# Patient Record
Sex: Male | Born: 1945 | Race: White | Hispanic: No | Marital: Single | Smoking: Former smoker
Health system: Southern US, Community
[De-identification: ages and names within clinical notes are randomized; demographics above are authoritative.]

---

## 2021-07-03 ENCOUNTER — Other Ambulatory Visit: Payer: Self-pay

## 2021-07-03 ENCOUNTER — Emergency Department (HOSPITAL_COMMUNITY): Payer: Medicare Other

## 2021-07-03 ENCOUNTER — Inpatient Hospital Stay (HOSPITAL_COMMUNITY)
Admission: EM | Admit: 2021-07-03 | Discharge: 2021-07-06 | DRG: 683 | Disposition: A | Discharge status: Skilled Nursing Facility | Payer: Medicare Other | Attending: Internal Medicine | Admitting: Internal Medicine

## 2021-07-03 ENCOUNTER — Encounter (HOSPITAL_COMMUNITY): Payer: Self-pay | Admitting: Emergency Medicine

## 2021-07-03 DIAGNOSIS — N139 Obstructive and reflux uropathy, unspecified: Secondary | ICD-10-CM | POA: Diagnosis present

## 2021-07-03 DIAGNOSIS — N2889 Other specified disorders of kidney and ureter: Secondary | ICD-10-CM | POA: Diagnosis present

## 2021-07-03 DIAGNOSIS — R338 Other retention of urine: Secondary | ICD-10-CM | POA: Diagnosis present

## 2021-07-03 DIAGNOSIS — I160 Hypertensive urgency: Secondary | ICD-10-CM | POA: Diagnosis present

## 2021-07-03 DIAGNOSIS — N179 Acute kidney failure, unspecified: Principal | ICD-10-CM | POA: Diagnosis present

## 2021-07-03 DIAGNOSIS — R339 Retention of urine, unspecified: Secondary | ICD-10-CM

## 2021-07-03 DIAGNOSIS — E860 Dehydration: Secondary | ICD-10-CM | POA: Diagnosis present

## 2021-07-03 DIAGNOSIS — N390 Urinary tract infection, site not specified: Secondary | ICD-10-CM | POA: Diagnosis present

## 2021-07-03 DIAGNOSIS — K5900 Constipation, unspecified: Secondary | ICD-10-CM | POA: Diagnosis present

## 2021-07-03 DIAGNOSIS — I10 Essential (primary) hypertension: Secondary | ICD-10-CM | POA: Diagnosis present

## 2021-07-03 DIAGNOSIS — N1330 Unspecified hydronephrosis: Secondary | ICD-10-CM | POA: Diagnosis present

## 2021-07-03 DIAGNOSIS — E872 Acidosis, unspecified: Secondary | ICD-10-CM | POA: Diagnosis present

## 2021-07-03 DIAGNOSIS — N401 Enlarged prostate with lower urinary tract symptoms: Secondary | ICD-10-CM | POA: Diagnosis present

## 2021-07-03 DIAGNOSIS — N136 Pyonephrosis: Secondary | ICD-10-CM | POA: Diagnosis present

## 2021-07-03 DIAGNOSIS — Z20822 Contact with and (suspected) exposure to covid-19: Secondary | ICD-10-CM | POA: Diagnosis present

## 2021-07-03 DIAGNOSIS — E876 Hypokalemia: Secondary | ICD-10-CM | POA: Diagnosis present

## 2021-07-03 DIAGNOSIS — N400 Enlarged prostate without lower urinary tract symptoms: Secondary | ICD-10-CM | POA: Diagnosis present

## 2021-07-03 DIAGNOSIS — R319 Hematuria, unspecified: Secondary | ICD-10-CM | POA: Diagnosis present

## 2021-07-03 DIAGNOSIS — N19 Unspecified kidney failure: Secondary | ICD-10-CM | POA: Diagnosis present

## 2021-07-03 DIAGNOSIS — W19XXXA Unspecified fall, initial encounter: Secondary | ICD-10-CM | POA: Diagnosis present

## 2021-07-03 DIAGNOSIS — Z87891 Personal history of nicotine dependence: Secondary | ICD-10-CM

## 2021-07-03 DIAGNOSIS — R918 Other nonspecific abnormal finding of lung field: Secondary | ICD-10-CM | POA: Diagnosis present

## 2021-07-03 DIAGNOSIS — N3001 Acute cystitis with hematuria: Secondary | ICD-10-CM

## 2021-07-03 LAB — BASIC METABOLIC PANEL
Anion gap: 17 — ABNORMAL HIGH (ref 5–15)
BUN: 87 mg/dL — ABNORMAL HIGH (ref 8–23)
CO2: 19 mmol/L — ABNORMAL LOW (ref 22–32)
Calcium: 8.2 mg/dL — ABNORMAL LOW (ref 8.9–10.3)
Chloride: 101 mmol/L (ref 98–111)
Creatinine, Ser: 6.55 mg/dL — ABNORMAL HIGH (ref 0.61–1.24)
GFR, Estimated: 8 mL/min — ABNORMAL LOW (ref >60)
Glucose, Bld: 173 mg/dL — ABNORMAL HIGH (ref 70–99)
Potassium: 4 mmol/L (ref 3.5–5.1)
Sodium: 137 mmol/L (ref 135–145)

## 2021-07-03 LAB — URINALYSIS, ROUTINE W REFLEX MICROSCOPIC

## 2021-07-03 LAB — URINALYSIS, MICROSCOPIC (REFLEX)
Bacteria, UA: NONE SEEN
RBC / HPF: >50 RBC/hpf (ref 0–5)
Squamous Epithelial / HPF: NONE SEEN (ref 0–5)
WBC, UA: >50 WBC/hpf (ref 0–5)

## 2021-07-03 LAB — CBC
HCT: 40.3 % (ref 39.0–52.0)
Hemoglobin: 14.2 g/dL (ref 13.0–17.0)
MCH: 32.1 pg (ref 26.0–34.0)
MCHC: 35.2 g/dL (ref 30.0–36.0)
MCV: 91 fL (ref 80.0–100.0)
Platelets: 239 10*3/uL (ref 150–400)
RBC: 4.43 MIL/uL (ref 4.22–5.81)
RDW: 12.6 % (ref 11.5–15.5)
WBC: 9.6 10*3/uL (ref 4.0–10.5)
nRBC: 0 % (ref 0.0–0.2)

## 2021-07-03 LAB — CK: Total CK: 180 U/L (ref 49–397)

## 2021-07-03 LAB — LACTIC ACID, PLASMA
Lactic Acid, Venous: 2.3 mmol/L (ref 0.5–1.9)
Lactic Acid, Venous: 1.4 mmol/L (ref 0.5–1.9)

## 2021-07-03 MED ORDER — CEFTRIAXONE SODIUM 2 G IJ SOLR
2.0000 g | INTRAMUSCULAR | Status: DC
  Administered 2021-07-04 – 2021-07-06 (×3): 2 g via INTRAVENOUS
  Filled 2021-07-03 (×3): qty 20

## 2021-07-03 MED ORDER — ACETAMINOPHEN 325 MG PO TABS
650.0000 mg | ORAL_TABLET | Freq: Four times a day (QID) | ORAL | Status: DC | PRN
  Administered 2021-07-05 (×3): 650 mg via ORAL
  Filled 2021-07-03 (×3): qty 2

## 2021-07-03 MED ORDER — POLYETHYLENE GLYCOL 3350 17 G PO PACK: 17.0000 g | PACK | Freq: Every day | ORAL | Status: DC | PRN

## 2021-07-03 MED ORDER — SODIUM CHLORIDE 0.9 % IV BOLUS
2000.0000 mL | Freq: Once | INTRAVENOUS | Status: AC
  Administered 2021-07-03: 2000 mL via INTRAVENOUS

## 2021-07-03 MED ORDER — ACETAMINOPHEN 650 MG RE SUPP: 650.0000 mg | Freq: Four times a day (QID) | RECTAL | Status: DC | PRN

## 2021-07-03 MED ORDER — SODIUM CHLORIDE 0.9 % IV SOLN: INTRAVENOUS | Status: DC

## 2021-07-03 MED ORDER — SODIUM CHLORIDE 0.9% FLUSH
3.0000 mL | Freq: Two times a day (BID) | INTRAVENOUS | Status: DC
  Administered 2021-07-04: 3 mL via INTRAVENOUS

## 2021-07-03 MED ORDER — SODIUM CHLORIDE 0.9 % IR SOLN
3000.0000 mL | Status: DC
  Administered 2021-07-03: 3000 mL

## 2021-07-03 MED ORDER — SODIUM CHLORIDE 0.9 % IV SOLN
2.0000 g | Freq: Once | INTRAVENOUS | Status: AC
  Administered 2021-07-03: 2 g via INTRAVENOUS
  Filled 2021-07-03: qty 20

## 2021-07-03 NOTE — ED Notes (Signed)
Pt c/o increasing abd pain. Noted pt has had 1700 in and 1700 out of irrigation. Irrigation paused. Abd exam reveals pt with rigid abd. EDP notified.

## 2021-07-03 NOTE — ED Triage Notes (Signed)
Neighbor/pt stated, He has been sick for over a week with peeing straight blood in urine. He fell at his house over a week ago and hurt his back. He has not been eating for over a week Pt stated, Ive not had a bowel movement in a week. I pee a little all the time but its all bloody.

## 2021-07-03 NOTE — ED Notes (Addendum)
Bladder scanner completed , bloody discharge leaking from urethra.

## 2021-07-03 NOTE — ED Notes (Signed)
Awaiting irrigation cath from central supply. Placed dual lumen for emergent bladder decompression until irrigation cath is available.

## 2021-07-03 NOTE — ED Provider Notes (Signed)
Patient is a 75 year old male whose care was transferred to me at shift change from [facility].  His HPI is below:  75 year old male presents today for evaluation of persistent weakness, and fall that occurred 1 week ago.  Patient reports his weakness has been ongoing for about 1.5 weeks.  Reports he had not had a bowel movement for 1 week until this morning.  States since the fall that occurred 1 week ago where secondary to weakness patient stood up from his couch and fell backwards between his couch and his coffee table.  He denies loss of consciousness during this fall.  Reports since the fall he has had back pain and gross hematuria.  He denies fever, chills, shortness of breath, palpitations, chest pain, or headache.   Hematuria Associated symptoms include abdominal pain. Pertinent negatives include no chest pain and no shortness of breath.  Back Pain Associated symptoms: abdominal pain and weakness   Associated symptoms: no chest pain and no fever   Fall Associated symptoms include abdominal pain. Pertinent negatives include no chest pain and no shortness of breath.  Abdominal Pain Associated symptoms: hematuria   Associated symptoms: no chest pain, no chills, no fever, no nausea, no shortness of breath and no vomiting    Physical Exam  BP (!) 151/59    Pulse 90    Temp 98.3 F (36.8 C) (Oral)    Resp 14    SpO2 100%   Physical Exam Vitals and nursing note reviewed.  Constitutional:      General: He is not in acute distress.    Appearance: He is well-developed.  HENT:     Head: Normocephalic and atraumatic.     Right Ear: External ear normal.     Left Ear: External ear normal.  Eyes:     General: No scleral icterus.       Right eye: No discharge.        Left eye: No discharge.     Conjunctiva/sclera: Conjunctivae normal.  Neck:     Trachea: No tracheal deviation.  Cardiovascular:     Rate and Rhythm: Normal rate.  Pulmonary:     Effort: Pulmonary effort is normal. No  respiratory distress.     Breath sounds: No stridor.  Abdominal:     General: There is no distension.  Musculoskeletal:        General: No swelling or deformity.     Cervical back: Neck supple.  Skin:    General: Skin is warm and dry.     Findings: No rash.  Neurological:     Mental Status: He is alert.     Cranial Nerves: Cranial nerve deficit: no gross deficits.   ED Course/Procedures   Clinical Course as of 07/03/21 1843  Sat Jul 03, 2021  1701 Patient has produced about 2000 cc of hematuria. [LJ]  [id number] Patient discussed with Dr. Jonnie Finner with nephrology.  Recommends we obtain a renal ultrasound as well. [LJ]  [id number] Patient discussed with Dr. Diona Fanti with urology.  Request that nurses provide the urology cart at bedside.  He is going to evaluate the patient in the ED. [LJ]    Clinical Course User Index [LJ] Rayna Sexton, PA-C    .Critical Care Performed by: Rayna Sexton, PA-C Authorized by: Rayna Sexton, PA-C   Critical care provider statement:    Critical care time (minutes):  45   Critical care was necessary to treat or prevent imminent or life-threatening deterioration of the following conditions:  Renal  failure   Critical care was time spent personally by me on the following activities:  Development of treatment plan with patient or surrogate, discussions with consultants, evaluation of patient's response to treatment, examination of patient, ordering and review of laboratory studies, ordering and review of radiographic studies, ordering and performing treatments and interventions, pulse oximetry, re-evaluation of patient's condition and review of old charts  MDM  Patient is a 75 year old male with no known medical history as he has not seen a medical provider in about 25 years.  He states that about 2 weeks ago he began experiencing dysuria.  This morning he then began noticing hematuria.  Patient was found to be retaining urine and a Foley catheter was placed.   Since, patient has produced about 2000 cc of hematuria.  Lab work is since resulted.  CBC without abnormalities.  BMP with a CO2 of 19, glucose of 173, BUN of 87, creatinine 6.55, calcium of 8.2, GFR of 8, anion gap of 17.  Unsure of patient's baseline creatinine and GFR.  Possible acute renal failure.  UA mostly not readable due to frank hematuria but does show greater than 50 white blood cells.  Given patient's symptoms as well as this finding we will treat for acute cystitis with IV Rocephin.  Initial lactic acid 2.3 with a repeat of 1.4.  CK1 80.  Blood cultures obtained.  CT scan obtained of the abdomen/pelvis without contrast with findings as noted below:  IMPRESSION: 1. Numerous bladder calculi are present.   2. Heterogeneous hyperdensity in the bladder suspicious for hemorrhage measuring 7.0 x 5.6 x 8.1 cm. Bladder neoplasm can not be excluded.   3. Bladder is distended despite Foley catheter placement. There is mild bilateral hydroureteronephrosis.   4. Marked prostatomegaly.   5. There are pulmonary nodules in the left lower lobe measuring up to 5 mm. Consider follow-up dedicated chest CT for further evaluation. No follow-up needed if patient is low-risk (and has no known or suspected primary neoplasm). Non-contrast chest CT can be considered in 12 months if patient is high-risk. This recommendation follows the consensus statement: Guidelines for Management of Incidental Pulmonary Nodules Detected on CT Images: From the Fleischner Society 2017; Radiology 2017; 284:228-243.  Patient discussed with Dr. Arta Silence with nephrology.  Recommends we obtain a renal ultrasound as well.  Nephrology will follow.  Patient also discussed with Dr. Retta Diones with urology.  Requested the urology cart and he is evaluating the patient at bedside.  Patient will need to be admitted to the medicine team for treatment of his acute cystitis as well as further management of his acute renal failure.  This  has been discussed with the patient and he is amenable.  We will discuss with the medicine team at this time.        Placido Sou, PA-C 07/03/21 1843    Pricilla Loveless, MD 07/04/21 1517

## 2021-07-03 NOTE — Consult Note (Signed)
Renal Service Consult Note [facility]  Blake Medina 07/03/2021 Maree Krabbe, MD Requesting Physician: Dr. Criss Alvine  Reason for Consult: Renal failure HPI: The patient is a 75 y.o. year-old w/ no PMH presented to ED today for hematuria and dysuria for about 1 week.  Also fatigue and no appetite for about 2 wks. Hasn't seen a doctor in 25 yrs, on no medications. In ED BP's wnl, HR, RR stable. Labs show Hb 14, creat 6.5.  CT abd shows very large prostate, distended bladder despite foley in place, bladder hyperdensity c/w hemorrhage but can't rule out a mass, multiple bladder stones and bilat mild hydroureteronephrosis. In ED pt was found to be retaining urine and a Foley catheter was placed. Since then has produces about 2000 cc of hematuria. Asked to see for renal failure.   Pt seen in room. Pt lives alone in [place]. His kids all live in [place] he is not in touch with them. Quit smoking 4 yrs ago, no etoh. Main issues have been pain and blood w/ urination for 1-2 weeks. Also severe fatigue for a couple of weeks and poor appetite.    ROS - denies CP, no joint pain, no HA, no blurry vision, no rash, no diarrhea, no nausea/ vomiting   Past Medical History History reviewed. No pertinent past medical history. Past Surgical History History reviewed. No pertinent surgical history. Family History History reviewed. No pertinent family history. Social History  reports that he has quit smoking. His smoking use included cigarettes. He has never used smokeless tobacco. He reports that he does not currently use alcohol. He reports that he does not use drugs. Allergies Not on File Home medications Prior to Admission medications   Not on File     Vitals:   07/03/21 1400 07/03/21 1600 07/03/21 1700 07/03/21 1730  BP: (!) 155/63 126/64 (!) 158/68 (!) 151/59  Pulse: 86 83 83 90  Resp: 16 16 16 14   Temp:      TempSrc:      SpO2: 100% 100% 100% 100%   Exam Gen alert, no distress,  pleasant elderly man in no distress No rash, cyanosis or gangrene Sclera anicteric, throat is moderately dry  No jvd or bruits, flat neck veins Chest clear bilat to bases, no rales/ wheezing RRR no MRG Abd soft ntnd no mass or ascites +bs GU foley cath in place MS no joint effusions or deformity Ext no LE or UE edema, no wounds or ulcers Neuro is alert, Ox 3 , nf, no asterixis      Home meds include - none      UA turbid, red color, > 50 rbc/ wbc per hpf, other tests not valid due to severe hematuria     CT abd w/o contrast 12/31 - IMPRESSION: 1. Numerous bladder calculi are present.  2. Heterogeneous hyperdensity in the bladder suspicious for hemorrhage measuring 7.0 x 5.6 x 8.1 cm. Bladder neoplasm can not be excluded. 3. Bladder is distended despite Foley catheter placement. There is mild bilateral hydroureteronephrosis. 4. Marked prostatomegaly 5. There are pulmonary nodules in the left lower lobe measuring up to 5 mm.    Na 137  K 4.0  CUN 87  Creat 6.55  Ca 8.2  WBC 9K Hb 14    Assessment/ Plan: Renal failure - presumably this is acute renal failure, no old labs, no medical care for 25 yrs. Pt here w/ creat 6.5 and CT showing bilat hydronephrosis in setting of recent onset difficulty voiding,  dysuria and hematuria.  After foley placed in ED about 2000 cc of hematuria has been produced. Probably has some early uremia w/ fatigue and loss of appetite. Would not recommend HD however as UOP after foley placement is very good and renal function should improve. Also appears dry on exam, will give IVF's. Urology is consulting. Will follow.  Hematuria/ voiding difficulty - per urology Volume depletion - not eating well last couple of weeks. Will bolus 2 L and run NS 0.9% at 100 cc/hr.        Kelly Splinter  MD 07/03/2021, 6:44 PM  Recent Labs  Lab 07/03/21 0747  WBC 9.6  HGB 14.2   Recent Labs  Lab 07/03/21 0747  K 4.0  BUN 87*  CREATININE 6.55*  CALCIUM 8.2*

## 2021-07-03 NOTE — ED Provider Notes (Signed)
I provided a substantive portion of the care of this patient.  I personally performed the entirety of the exam for this encounter.      Patient has generalized weakness.  Traces this back to a fall that occurred a week ago.  Since then he reports pain in his back and blood in the urine.  Patient is alert with clear mental status.  No respiratory distress.  Lungs grossly clear.  Heart regular.  Lower abdomen is firm.  He is denying significant tenderness at this point time.  He is already had a Foley catheter placed.  Bloody urine in the catheter bag  Patient presents as outlined.  Labs reveal acute renal failure.  At this point likely obstructive.  Agree with proceeding with CT scan.  Patient will require admission.  I agree with plan of management.     Arby Barrette, MD 07/07/21 [phone]

## 2021-07-03 NOTE — ED Notes (Signed)
Patient transported to Ultrasound 

## 2021-07-03 NOTE — ED Notes (Signed)
Patient transported to CT 

## 2021-07-03 NOTE — ED Notes (Signed)
Urologist at bedside.

## 2021-07-03 NOTE — Consult Note (Signed)
Urology Consult  Consulting MD: Beola Cord, MD  CC: Blood in urine, bladder stones  HPI: This is a 75year old male presenting to the emergency room at [facility] today with history of dysuria, gross hematuria, weakness and a recent fall.  He has no prior urologic history.  He apparently has not seen a physician in over 20 years.  He states that for the past few weeks he has had dysuria and worsening lower urinary tract symptoms.  Within the past 24 hours he has started to have gross hematuria.  Combined with weakness and a recent fall, he called a family member and was brought to the emergency room.  In the emergency room, with his history of hematuria, CT scan of the abdomen and pelvis was performed without contrast (patient had acute renal failure).  This revealed mild hydronephrosis, distended bladder, multiple large bladder calculi, large prostate and probable clot in the bladder.  The CT scan was taken after a catheter was placed.  Because of the above-mentioned urologic issues, urologic consultation was requested.  PMH: History reviewed. No pertinent past medical history.  PSH: History reviewed. No pertinent surgical history.  Allergies: Not on File  Medications: (Not in a hospital admission)    Social History: Social History   Socioeconomic History   Marital status: Single    Spouse name: Not on file   Number of children: Not on file   Years of education: Not on file   Highest education level: Not on file  Occupational History   Not on file  Tobacco Use   Smoking status: Former    Types: Cigarettes   Smokeless tobacco: Never  Substance and Sexual Activity   Alcohol use: Not Currently   Drug use: Never   Sexual activity: Not on file  Other Topics Concern   Not on file  Social History Narrative   Not on file   Social Determinants of Health   Financial Resource Strain: Not on file  Food Insecurity: Not on file  Transportation Needs: Not on  file  Physical Activity: Not on file  Stress: Not on file  Social Connections: Not on file  Intimate Partner Violence: Not on file    Family History: History reviewed. No pertinent family history.  Review of Systems: Positive: Gross hematuria, dysuria, slow stream, frequency, urgency, feeling of incomplete emptying Negative: No fever or chills.  A further 10 point review of systems was negative except what is listed in the HPI.  Physical Exam: @VITALS2 @ General: No acute distress.  Awake. Head:  Normocephalic.  Atraumatic. ENT:  EOMI.  Mucous membranes moist Neck:  Supple.  No lymphadenopathy. CV:  Regular rate. Pulmonary: Equal effort bilaterally.   Abdomen: Soft.  None tender to palpation.  No masses present. Skin:  Normal turgor.  No visible rash. Extremity: No gross deformity of extremities.  Neurologic: Alert. Appropriate mood. Penis:  Incircumcised.  No lesions. Urethra: Orthotopic meatus.  18 French three-way Foley catheter was present Scrotum: No lesions.  No ecchymosis.  No erythema. Testicles: Descended bilaterally.  No masses bilaterally. Epididymis: Palpable bilaterally.  Non Tender to palpation.  Studies:  Recent Labs    07/03/21 0747  HGB 14.2  WBC 9.6  PLT 239    Recent Labs    07/03/21 0747  NA 137  K 4.0  CL 101  CO2 19*  BUN 87*  CREATININE 6.55*  CALCIUM 8.2*  GFRNONAA 8*     No results for input(s): INR, APTT in the  last 72 hours.  Invalid input(s): PT   Invalid input(s): ABG  I reviewed the above-mentioned laboratories as well as personally reviewed CT images.  When I came in to see the patient, he was in the ultrasound department.  Even after CT scan was performed, this was requested.  Procedure note: The 58 French Foley catheter was removed.  After sterile prep and drape and using 30 cc of 2% viscous lidocaine in the urethra, a 24 [place] three-way Foley/hematuria catheter was placed.  I irrigated with 1 L of saline.  Multiple clots  were liberated.  Following this, irrigant/effluent was grapefruit colored.  Assessment: 1.  Gross hematuria, possibly due to infection/stones.  Less likely neoplasm.  He does have a large prostate.  Clot was evacuated  2.  Urinary retention with associated hydronephrosis and acute renal failure.  Treated emergently with Foley catheter  3.  Multiple large bladder calculi    Plan: 1.  Catheter will be left in place.  I do not think he needs to be on CBI at present.  I will order routine catheter irrigation by nursing staff  2.  Appropriate urine studies/urine culture.  Currently I would recommend covering with antibiotics  3.  Hopefully, acute renal failure will resolve with catheter placement.  He will need to have this catheter for quite some time.  Unfortunately, he does not have good support at home, so until further management of his prostate/bladder stones is performed, I would leave the catheter in  4.  Eventually, I would suggest TURP or other procedure for outlet obstruction i.e. HoLEP or simple prostatectomy.  At the same time, cystolitholapaxy can be done  5.  We will follow during hospitalization    Pager:[phone]

## 2021-07-03 NOTE — ED Provider Notes (Signed)
[facility] EMERGENCY DEPARTMENT Provider Note   CSN: [id number] Arrival date & time: 07/03/21  [id number]     History Chief Complaint  Patient presents with   Hematuria   Back Pain   Fall   Abdominal Pain    Blake Medina is a 75 y.o. male.  75 year old male presents today for evaluation of persistent weakness, and fall that occurred 1 week ago.  Patient reports his weakness has been ongoing for about 1.5 weeks.  Reports he had not had a bowel movement for 1 week until this morning.  States since the fall that occurred 1 week ago where secondary to weakness patient stood up from his couch and fell backwards between his couch and his coffee table.  He denies loss of consciousness during this fall.  Reports since the fall he has had back pain and gross hematuria.  He denies fever, chills, shortness of breath, palpitations, chest pain, or headache.   Hematuria Associated symptoms include abdominal pain. Pertinent negatives include no chest pain and no shortness of breath.  Back Pain Associated symptoms: abdominal pain and weakness   Associated symptoms: no chest pain and no fever   Fall Associated symptoms include abdominal pain. Pertinent negatives include no chest pain and no shortness of breath.  Abdominal Pain Associated symptoms: hematuria   Associated symptoms: no chest pain, no chills, no fever, no nausea, no shortness of breath and no vomiting       No past medical history on file.  There are no problems to display for this patient.   History reviewed. No pertinent surgical history.     No family history on file.  Social History   Tobacco Use   Smoking status: Former    Types: Cigarettes   Smokeless tobacco: Never  Substance Use Topics   Alcohol use: Not Currently   Drug use: Never    Home Medications Prior to Admission medications   Not on File    Allergies    Patient has no allergy information on record.  Review of Systems    Review of Systems  Constitutional:  Negative for activity change, chills and fever.  Respiratory:  Negative for shortness of breath.   Cardiovascular:  Negative for chest pain and palpitations.  Gastrointestinal:  Positive for abdominal pain. Negative for nausea and vomiting.  Genitourinary:  Positive for hematuria.  Musculoskeletal:  Positive for back pain.  Neurological:  Positive for weakness. Negative for syncope and light-headedness.  All other systems reviewed and are negative.  Physical Exam Updated Vital Signs BP (!) 129/59    Pulse 91    Temp 98.3 F (36.8 C) (Oral)    Resp 16    SpO2 100%   Physical Exam Vitals and nursing note reviewed.  Constitutional:      General: He is not in acute distress.    Appearance: Normal appearance. He is not ill-appearing.  HENT:     Head: Normocephalic and atraumatic.     Nose: Nose normal.  Eyes:     General: No scleral icterus.    Extraocular Movements: Extraocular movements intact.     Conjunctiva/sclera: Conjunctivae normal.  Cardiovascular:     Rate and Rhythm: Normal rate and regular rhythm.     Pulses: Normal pulses.     Heart sounds: Normal heart sounds.  Pulmonary:     Effort: Pulmonary effort is normal. No respiratory distress.     Breath sounds: Normal breath sounds. No wheezing or rales.  Abdominal:  General: There is no distension.     Tenderness: There is no abdominal tenderness.  Musculoskeletal:        General: Normal range of motion.     Cervical back: Normal range of motion.  Skin:    General: Skin is warm and dry.  Neurological:     General: No focal deficit present.     Mental Status: He is alert. Mental status is at baseline.    ED Results / Procedures / Treatments   Labs (all labs ordered are listed, but only abnormal results are displayed) Labs Reviewed  URINALYSIS, ROUTINE W REFLEX MICROSCOPIC - Abnormal; Notable for the following components:      Result Value   Color, Urine RED (*)     APPearance TURBID (*)    Glucose, UA   (*)    Value: TEST NOT REPORTED DUE TO COLOR INTERFERENCE OF URINE PIGMENT   Hgb urine dipstick   (*)    Value: TEST NOT REPORTED DUE TO COLOR INTERFERENCE OF URINE PIGMENT   Bilirubin Urine   (*)    Value: TEST NOT REPORTED DUE TO COLOR INTERFERENCE OF URINE PIGMENT   Ketones, ur   (*)    Value: TEST NOT REPORTED DUE TO COLOR INTERFERENCE OF URINE PIGMENT   Protein, ur   (*)    Value: TEST NOT REPORTED DUE TO COLOR INTERFERENCE OF URINE PIGMENT   Nitrite   (*)    Value: TEST NOT REPORTED DUE TO COLOR INTERFERENCE OF URINE PIGMENT   Leukocytes,Ua   (*)    Value: TEST NOT REPORTED DUE TO COLOR INTERFERENCE OF URINE PIGMENT   All other components within normal limits  BASIC METABOLIC PANEL - Abnormal; Notable for the following components:   CO2 19 (*)    Glucose, Bld 173 (*)    BUN 87 (*)    Creatinine, Ser 6.55 (*)    Calcium 8.2 (*)    GFR, Estimated 8 (*)    Anion gap 17 (*)    All other components within normal limits  CULTURE, BLOOD (ROUTINE X 2)  CULTURE, BLOOD (ROUTINE X 2)  CBC  URINALYSIS, MICROSCOPIC (REFLEX)  CK  LACTIC ACID, PLASMA  LACTIC ACID, PLASMA    EKG None  Radiology No results found.  Procedures Procedures   Medications Ordered in ED Medications  sodium chloride irrigation 0.9 % 3,000 mL (0 mLs Irrigation Paused 07/03/21 1339)  cefTRIAXone (ROCEPHIN) 2 g in sodium chloride 0.9 % 100 mL IVPB (has no administration in time range)    ED Course  I have reviewed the triage vital signs and the nursing notes.  Pertinent labs & imaging results that were available during my care of the patient were reviewed by me and considered in my medical decision making (see chart for details).    MDM Rules/Calculators/A&P                         75 year old male presents today for evaluation of weakness and the fall that occurred 1 week ago.  Patient on arrival had 761 mL on bladder scan.  Foley placed and irrigation  performed.  Patient following irrigation remains with abdominal distention and tenderness to palpation.  Gross hematuria present.  Will obtain CT abdomen and pelvis without contrast given his acute kidney injury with creatinine of 6.5.  Without emergent indication of dialysis. Patient also reports dysuria of 2-week duration.  Will obtain lactic acid and blood cultures and start  patient on ceftriaxone.   CT abdomen pelvis without contrast pending at the end of my shift.  Patient signed out to oncoming provider for follow-up on imaging.  Patient will likely require admission given his new renal function of 6.5.     Final Clinical Impression(s) / ED Diagnoses Final diagnoses:  None    Rx / DC Orders ED Discharge Orders     None        Evlyn Courier, PA-C 07/03/21 1544    Charlesetta Shanks, MD 07/07/21 [phone]

## 2021-07-03 NOTE — H&P (Signed)
History and Physical   Blake Medina [id number] DOB: Dec 15, 1945 DOA: 07/03/2021  PCP: Pcp, No   Patient coming from: Home  Chief Complaint: Weakness and fall  HPI: Blake Medina is a 75 y.o. male with no known medical history (he has not seen a doctor in many years) who presents with persistent weakness and fall.  With subsequent back pain and hematuria.  Patient states that he has had some degree of weakness for the past week and a half.  He had a fall 1 week ago with some resulting back pain and subsequent bloody urine.  Denies any loss of consciousness with the fall.  He also reports he did have a bowel movement for the past week until today.  He denies fevers, chills, chest pain, shortness of breath, abdominal pain, nausea, vomiting.   ED Course: Vital signs in the ED significant for blood pressure in the [id number] to [id number] systolic.  Lab work-up showed BMP with BUN 87 and creatinine elevated to 6.55 with no history of renal disease, glucose 173, calcium 8.2.  CBC within normal limits.  CK normal.  Lactic acid initially mildly elevated 2.3 and then normal on repeat.  Urinalysis with many red cells, no bacteria but positive for white cells.  Blood cultures pending.  CT abdomen pelvis showed numerous bladder calculi, bladder hypodensity suspicious for hemorrhage, distended bladder despite Foley and bilateral hydroureter O nephrosis, prostatic megaly, pulmonary nodules.  Renal ultrasound ordered and pending.  Patient received dose of ceftriaxone for possible UTI and bladder irrigation was initiated.  Nephrology was consulted who recommended renal ultrasound and will follow along.  Urology also consulted and will be evaluating patient at the bedside (urology cart requested and is in the room).  Review of Systems: As per HPI otherwise all other systems reviewed and are negative.  History reviewed. No pertinent past medical history.  History reviewed. No pertinent surgical history.  Social  History  reports that he has quit smoking. His smoking use included cigarettes. He has never used smokeless tobacco. He reports that he does not currently use alcohol. He reports that he does not use drugs.  Not on File  History reviewed. No pertinent family history.  Prior to Admission medications   Not on File  None  Physical Exam: Vitals:   07/03/21 1400 07/03/21 1600 07/03/21 1700 07/03/21 1730  BP: (!) 155/63 126/64 (!) 158/68 (!) 151/59  Pulse: 86 83 83 90  Resp: 16 16 16 14   Temp:      TempSrc:      SpO2: 100% 100% 100% 100%   Physical Exam Constitutional:      General: He is not in acute distress.    Appearance: Normal appearance.  HENT:     Head: Normocephalic and atraumatic.     Mouth/Throat:     Mouth: Mucous membranes are moist.     Pharynx: Oropharynx is clear.  Eyes:     Extraocular Movements: Extraocular movements intact.     Pupils: Pupils are equal, round, and reactive to light.  Cardiovascular:     Rate and Rhythm: Normal rate and regular rhythm.     Pulses: Normal pulses.     Heart sounds: Normal heart sounds.  Pulmonary:     Effort: Pulmonary effort is normal. No respiratory distress.     Breath sounds: Normal breath sounds.  Abdominal:     General: Bowel sounds are normal. There is no distension.     Palpations: Abdomen is soft.  Tenderness: There is no abdominal tenderness.  Genitourinary:    Comments: Catheter in place.  Bloody drainage. Musculoskeletal:        General: No swelling or deformity.  Skin:    General: Skin is warm and dry.  Neurological:     General: No focal deficit present.     Mental Status: Mental status is at baseline.   Labs on Admission: I have personally reviewed following labs and imaging studies  CBC: Recent Labs  Lab 07/03/21 0747  WBC 9.6  HGB 14.2  HCT 40.3  MCV 91.0  PLT [id number]    Basic Metabolic Panel: Recent Labs  Lab 07/03/21 0747  NA 137  K 4.0  CL 101  CO2 19*  GLUCOSE 173*  BUN 87*   CREATININE 6.55*  CALCIUM 8.2*    GFR: CrCl cannot be calculated (Unknown ideal weight.).  Liver Function Tests: No results for input(s): AST, ALT, ALKPHOS, BILITOT, PROT, ALBUMIN in the last 168 hours.  Urine analysis:    Component Value Date/Time   COLORURINE RED (A) 07/03/2021 1214   APPEARANCEUR TURBID (A) 07/03/2021 1214   LABSPEC  07/03/2021 1214    TEST NOT REPORTED DUE TO COLOR INTERFERENCE OF URINE PIGMENT   PHURINE  07/03/2021 1214    TEST NOT REPORTED DUE TO COLOR INTERFERENCE OF URINE PIGMENT   GLUCOSEU (A) 07/03/2021 1214    TEST NOT REPORTED DUE TO COLOR INTERFERENCE OF URINE PIGMENT   HGBUR (A) 07/03/2021 1214    TEST NOT REPORTED DUE TO COLOR INTERFERENCE OF URINE PIGMENT   BILIRUBINUR (A) 07/03/2021 1214    TEST NOT REPORTED DUE TO COLOR INTERFERENCE OF URINE PIGMENT   KETONESUR (A) 07/03/2021 1214    TEST NOT REPORTED DUE TO COLOR INTERFERENCE OF URINE PIGMENT   PROTEINUR (A) 07/03/2021 1214    TEST NOT REPORTED DUE TO COLOR INTERFERENCE OF URINE PIGMENT   NITRITE (A) 07/03/2021 1214    TEST NOT REPORTED DUE TO COLOR INTERFERENCE OF URINE PIGMENT   LEUKOCYTESUR (A) 07/03/2021 1214    TEST NOT REPORTED DUE TO COLOR INTERFERENCE OF URINE PIGMENT    Radiological Exams on Admission: CT ABDOMEN PELVIS WO CONTRAST  Addendum Date: 07/03/2021   ADDENDUM REPORT: 07/03/2021 17:01 IMPRESSION: 1. Numerous bladder calculi are present. 2. Heterogeneous hyperdensity in the bladder suspicious for hemorrhage measuring 7.0 x 5.6 x 8.1 cm. Bladder neoplasm can not be excluded. 3. Bladder is distended despite Foley catheter placement. There is mild bilateral hydroureteronephrosis. 4. Marked prostatomegaly. 5. There are pulmonary nodules in the left lower lobe measuring up to 5 mm. Consider follow-up dedicated chest CT for further evaluation. No follow-up needed if patient is low-risk (and has no known or suspected primary neoplasm). Non-contrast chest CT can be considered in 12  months if patient is high-risk. This recommendation follows the consensus statement: Guidelines for Management of Incidental Pulmonary Nodules Detected on CT Images: From the Fleischner Society 2017; Radiology 2017; 284:228-243. Electronically Signed   By: Ronney Asters M.D.   On: 07/03/2021 17:01   Result Date: 07/03/2021 CLINICAL DATA:  Acute abdominal pain.  Fall 1 week ago. EXAM: CT ABDOMEN AND PELVIS WITHOUT CONTRAST TECHNIQUE: Multidetector CT imaging of the abdomen and pelvis was performed following the standard protocol without IV contrast. COMPARISON:  None. FINDINGS: Lower chest: There is a 5 mm left lower lobe pulmonary nodule. Scratch there are 2 pulmonary nodules in the scratch there are 3 pulmonary nodules in the visualized left lower lobe measuring up to  5 mm. There is some tree-in-bud opacities in the left lung base, likely infectious/inflammatory. Hepatobiliary: No focal liver abnormality is seen. No gallstones, gallbladder wall thickening, or biliary dilatation. Pancreas: Unremarkable. No pancreatic ductal dilatation or surrounding inflammatory changes. Spleen: Normal in size without focal abnormality. Adrenals/Urinary Tract: There is mild bilateral hydronephrosis to the level of the bladder. No obstructing calculi are seen. No focal renal lesions are identified. The adrenal glands are within normal limits. Foley catheter is in the bladder. There is heterogeneous mild hyperdensity in the bladder suspicious for bladder mass versus hemorrhage measuring 7.5 by 5.0 by 4.3 cm. There are numerous bladder calculi measuring up to 15 mm. There is no inflammation surrounding the bladder. Small amount of air in the bladder is likely related to Foley catheter placement. Stomach/Bowel: No evidence of bowel wall thickening, distention, or inflammatory changes. The appendix is not visualized. There is a small hiatal hernia. Stomach is otherwise within normal limits. Vascular/Lymphatic: Aortic atherosclerosis.  No enlarged abdominal or pelvic lymph nodes. Reproductive: Prostate gland is enlarged measuring 7.0 x 5.6 by 8.1 cm. Other: No abdominal wall hernia or abnormality. No abdominopelvic ascites. Musculoskeletal: Degenerative changes affect the spine and hips. No acute fractures are seen. IMPRESSION: 1.  Numerous bladder calculi are present. 2. Heterogeneous hyperdensity in the bladder suspicious for hemorrhage measuring 7.0 x 5.6 x 8.1 cm. Bladder neoplasm can not be excluded. 3. Bladder is distended despite Foley catheter placement. There is mild bilateral hydroureteronephrosis. 4.  Marked prostatomegaly. Electronically Signed: By: Ronney Asters M.D. On: 07/03/2021 16:53   US Renal  Result Date: 07/03/2021 CLINICAL DATA:  Initial evaluation for hematuria, urinary retention. EXAM: RENAL / URINARY TRACT ULTRASOUND COMPLETE COMPARISON:  Prior CT from 06/23/2021. FINDINGS: Right Kidney: Renal measurements: 11.9 x 5.2 x 6.9 cm = volume: 224.8 mL. Renal echogenicity within normal limits. No nephrolithiasis or hydronephrosis. No focal renal mass. Left Kidney: Renal measurements: 11.2 x 5.1 x 6.2 cm = volume: 187.0 mL. Renal echogenicity within normal limits. No nephrolithiasis. Mild pelviectasis without overt hydronephrosis. No focal renal mass. Bladder: Not assessed on this examination. Other: None. IMPRESSION: 1. Mild left renal pelviectasis without overt hydronephrosis. 2. Otherwise unremarkable and normal renal ultrasound. Electronically Signed   By: Jeannine Boga M.D.   On: 07/03/2021 19:30    EKG: Not performed in the ED  Assessment/Plan Principal Problem:   Acute renal failure (ARF) (HCC) Active Problems:   Acute lower UTI   Obstructive uropathy  Acute renal failure Obstructive uropathy > Patient presenting after weakness and a fall with subsequent back plain bloody urine. > Found to have significant blood in his urine and on imaging hydroureteronephrosis and prostatic megaly.  Also with  suspected hemorrhage in the bladder. > Nephrology and urology consulted in the ED and are following along with patient. > Foley is in placed with significant bloody output and continuous irrigation of the bladder has been initiated, urology to eval at bedside. - Appreciate recommendations of nephrology and urology - IV fluid bolus and maintenance as ordered by nephrology - Monitor on telemetry - Trend renal function electrolytes - Avoid nephrotoxic agents - Continue with bladder irrigation  ?UTI > WBC but no bacteria on U/A, but difficult to interpret test given RBCs/ - Continue with ceftriaxone  DVT prophylaxis: SCDs  Code Status:   Full Family Communication:  Partial, would not want ACLS or chest compressions but okay with temporary intubation  Disposition Plan:   Patient is from:  Home  Anticipated DC to:  Home  Anticipated DC date:  1 to 3 days  Anticipated DC barriers: None  Consults called:  Nephrology and urology consulted in ED and are following.   Admission status:  Observation, telemetry   Severity of Illness: The appropriate patient status for this patient is OBSERVATION. Observation status is judged to be reasonable and necessary in order to provide the required intensity of service to ensure the patient's safety. The patient's presenting symptoms, physical exam findings, and initial radiographic and laboratory data in the context of their medical condition is felt to place them at decreased risk for further clinical deterioration. Furthermore, it is anticipated that the patient will be medically stable for discharge from the hospital within 2 midnights of admission.    Synetta Fail MD [facility]  How to contact the [facility] Attending or Consulting provider 7A - 7P or covering provider during after hours 7P -7A, for this patient?   Check the care team in [facility] and look for a) attending/consulting [facility] provider listed and b) the [facility] team listed Log into  [email or web address] and use [place]'s universal password to access. If you do not have the password, please contact the hospital operator. Locate the [facility] provider you are looking for under [facility] and page to a number that you can be directly reached. If you still have difficulty reaching the provider, please page the [facility] (Director on Call) for the Hospitalists listed on amion for assistance.  07/03/2021, 7:52 PM

## 2021-07-04 ENCOUNTER — Other Ambulatory Visit: Payer: Self-pay

## 2021-07-04 DIAGNOSIS — N400 Enlarged prostate without lower urinary tract symptoms: Secondary | ICD-10-CM | POA: Diagnosis present

## 2021-07-04 DIAGNOSIS — R338 Other retention of urine: Secondary | ICD-10-CM | POA: Diagnosis present

## 2021-07-04 DIAGNOSIS — K5900 Constipation, unspecified: Secondary | ICD-10-CM | POA: Diagnosis present

## 2021-07-04 DIAGNOSIS — N1330 Unspecified hydronephrosis: Secondary | ICD-10-CM | POA: Diagnosis present

## 2021-07-04 DIAGNOSIS — I10 Essential (primary) hypertension: Secondary | ICD-10-CM | POA: Diagnosis present

## 2021-07-04 DIAGNOSIS — Z87891 Personal history of nicotine dependence: Secondary | ICD-10-CM | POA: Diagnosis not present

## 2021-07-04 DIAGNOSIS — E872 Acidosis, unspecified: Secondary | ICD-10-CM | POA: Diagnosis present

## 2021-07-04 DIAGNOSIS — I160 Hypertensive urgency: Secondary | ICD-10-CM | POA: Diagnosis present

## 2021-07-04 DIAGNOSIS — E860 Dehydration: Secondary | ICD-10-CM | POA: Diagnosis present

## 2021-07-04 DIAGNOSIS — N179 Acute kidney failure, unspecified: Secondary | ICD-10-CM | POA: Diagnosis present

## 2021-07-04 DIAGNOSIS — N136 Pyonephrosis: Secondary | ICD-10-CM | POA: Diagnosis present

## 2021-07-04 DIAGNOSIS — R339 Retention of urine, unspecified: Secondary | ICD-10-CM | POA: Diagnosis present

## 2021-07-04 DIAGNOSIS — R319 Hematuria, unspecified: Secondary | ICD-10-CM | POA: Diagnosis present

## 2021-07-04 DIAGNOSIS — N19 Unspecified kidney failure: Secondary | ICD-10-CM | POA: Diagnosis present

## 2021-07-04 DIAGNOSIS — R918 Other nonspecific abnormal finding of lung field: Secondary | ICD-10-CM | POA: Diagnosis present

## 2021-07-04 DIAGNOSIS — Z20822 Contact with and (suspected) exposure to covid-19: Secondary | ICD-10-CM | POA: Diagnosis present

## 2021-07-04 DIAGNOSIS — E876 Hypokalemia: Secondary | ICD-10-CM | POA: Diagnosis present

## 2021-07-04 DIAGNOSIS — N2889 Other specified disorders of kidney and ureter: Secondary | ICD-10-CM | POA: Diagnosis present

## 2021-07-04 DIAGNOSIS — N401 Enlarged prostate with lower urinary tract symptoms: Secondary | ICD-10-CM | POA: Diagnosis present

## 2021-07-04 DIAGNOSIS — W19XXXA Unspecified fall, initial encounter: Secondary | ICD-10-CM | POA: Diagnosis present

## 2021-07-04 LAB — URINALYSIS, ROUTINE W REFLEX MICROSCOPIC
Bilirubin Urine: NEGATIVE
Glucose, UA: NEGATIVE mg/dL
Ketones, ur: NEGATIVE mg/dL
Nitrite: NEGATIVE
Protein, ur: 30 mg/dL — AB
RBC / HPF: >50 RBC/hpf — ABNORMAL HIGH (ref 0–5)
Specific Gravity, Urine: 1.013 (ref 1.005–1.030)
pH: 5 (ref 5.0–8.0)

## 2021-07-04 LAB — COMPREHENSIVE METABOLIC PANEL
ALT: 35 U/L (ref 0–44)
AST: 25 U/L (ref 15–41)
Albumin: 2.4 g/dL — ABNORMAL LOW (ref 3.5–5.0)
Alkaline Phosphatase: 66 U/L (ref 38–126)
Anion gap: 10 (ref 5–15)
BUN: 50 mg/dL — ABNORMAL HIGH (ref 8–23)
CO2: 19 mmol/L — ABNORMAL LOW (ref 22–32)
Calcium: 7.3 mg/dL — ABNORMAL LOW (ref 8.9–10.3)
Chloride: 112 mmol/L — ABNORMAL HIGH (ref 98–111)
Creatinine, Ser: 2.32 mg/dL — ABNORMAL HIGH (ref 0.61–1.24)
GFR, Estimated: 28 mL/min — ABNORMAL LOW (ref >60)
Glucose, Bld: 118 mg/dL — ABNORMAL HIGH (ref 70–99)
Potassium: 3.5 mmol/L (ref 3.5–5.1)
Sodium: 141 mmol/L (ref 135–145)
Total Bilirubin: 0.6 mg/dL (ref 0.3–1.2)
Total Protein: 5.3 g/dL — ABNORMAL LOW (ref 6.5–8.1)

## 2021-07-04 LAB — CBC
HCT: 33.2 % — ABNORMAL LOW (ref 39.0–52.0)
Hemoglobin: 11.6 g/dL — ABNORMAL LOW (ref 13.0–17.0)
MCH: 31.9 pg (ref 26.0–34.0)
MCHC: 34.9 g/dL (ref 30.0–36.0)
MCV: 91.2 fL (ref 80.0–100.0)
Platelets: 196 10*3/uL (ref 150–400)
RBC: 3.64 MIL/uL — ABNORMAL LOW (ref 4.22–5.81)
RDW: 12.9 % (ref 11.5–15.5)
WBC: 11.3 10*3/uL — ABNORMAL HIGH (ref 4.0–10.5)
nRBC: 0 % (ref 0.0–0.2)

## 2021-07-04 LAB — RESP PANEL BY RT-PCR (FLU A&B, COVID) ARPGX2
Influenza A by PCR: NEGATIVE
Influenza B by PCR: NEGATIVE
SARS Coronavirus 2 by RT PCR: NEGATIVE

## 2021-07-04 LAB — CREATININE, URINE, RANDOM: Creatinine, Urine: 93 mg/dL

## 2021-07-04 LAB — SODIUM, URINE, RANDOM: Sodium, Ur: 55 mmol/L

## 2021-07-04 MED ORDER — FINASTERIDE 5 MG PO TABS
5.0000 mg | ORAL_TABLET | Freq: Every day | ORAL | Status: DC
  Administered 2021-07-04 – 2021-07-06 (×3): 5 mg via ORAL
  Filled 2021-07-04 (×3): qty 1

## 2021-07-04 MED ORDER — SODIUM BICARBONATE 8.4 % IV SOLN
INTRAVENOUS | Status: DC
  Filled 2021-07-04 (×2): qty 1000

## 2021-07-04 MED ORDER — CHLORHEXIDINE GLUCONATE CLOTH 2 % EX PADS
6.0000 | MEDICATED_PAD | Freq: Every day | CUTANEOUS | Status: DC
  Administered 2021-07-04 – 2021-07-06 (×3): 6 via TOPICAL

## 2021-07-04 NOTE — ED Notes (Signed)
ED TO INPATIENT HANDOFF REPORT  ED Nurse Name and Phone #: Katherina Wimer, 58  S Name/Age/Gender Blake Medina 76 y.o. male Room/Bed: 003C/003C  Code Status   Code Status: Partial Code  Home/SNF/Other Home Patient oriented to: self, place, time, and situation Is this baseline? Yes   Triage Complete: Triage complete  Chief Complaint Acute renal failure (ARF) ([place]) [N17.9] AKI (acute kidney injury) ([place]) [N17.9]  Triage Note Neighbor/pt stated, He has been sick for over a week with peeing straight blood in urine. He fell at his house over a week ago and hurt his back. He has not been eating for over a week Pt stated, Ive not had a bowel movement in a week. I pee a little all the time but its all bloody.     Allergies Not on File  Level of Care/Admitting Diagnosis ED Disposition     ED Disposition  Admit   Condition  --   [place]: [place] [100100]  Level of Care: Telemetry Medical [104]  May admit patient to [facility] or [facility] if equivalent level of care is available:: No  Covid Evaluation: Asymptomatic Screening Protocol (No Symptoms)  Diagnosis: AKI (acute kidney injury) [facility]) [id number]  Admitting Physician: Lavina Hamman [id number]  Attending Physician: Lavina Hamman [id number]  Estimated length of stay: past midnight tomorrow  Certification:: I certify this patient will need inpatient services for at least 2 midnights          B Medical/Surgery History History reviewed. No pertinent past medical history. History reviewed. No pertinent surgical history.   A IV Location/Drains/Wounds Patient Lines/Drains/Airways Status     Active Line/Drains/Airways     Name Placement date Placement time Site Days   Peripheral IV 07/03/21 20 G Right Antecubital 07/03/21  1443  Antecubital  1   Urethral Catheter A. Owens Shark, RN Triple-lumen 18 Fr. 07/03/21  1316  Triple-lumen  1            Intake/Output Last 24  hours  Intake/Output Summary (Last 24 hours) at 07/04/2021 1627 Last data filed at 07/04/2021 0421 Gross per 24 hour  Intake 2103.76 ml  Output 1600 ml  Net 503.76 ml    Labs/Imaging Results for orders placed or performed during the hospital encounter of 07/03/21 (from the past 48 hour(s))  Creatinine, urine, random     Status: None   Collection Time: 07/03/21  4:41 AM  Result Value Ref Range   Creatinine, Urine 93.00 mg/dL    Comment: Performed at [facility], [place] [address] [place], [place] [address]  Sodium, urine, random     Status: None   Collection Time: 07/03/21  4:41 AM  Result Value Ref Range   Sodium, Ur 55 mmol/L    Comment: Performed at [facility] [address], [place], [place] [address]  Blood culture (routine x 2)     Status: None (Preliminary result)   Collection Time: 07/03/21  7:21 AM   Specimen: BLOOD  Result Value Ref Range   Specimen Description BLOOD SITE NOT SPECIFIED    Special Requests      BOTTLES DRAWN AEROBIC AND ANAEROBIC Blood Culture adequate volume   Culture      NO GROWTH < 24 HOURS Performed at [facility], [place] [address] [place], [place] [address]    Report Status PENDING   Basic metabolic panel     Status: Abnormal   Collection Time: 07/03/21  7:47  AM  Result Value Ref Range   Sodium 137 135 - 145 mmol/L   Potassium 4.0 3.5 - 5.1 mmol/L   Chloride 101 98 - 111 mmol/L   CO2 19 (L) 22 - 32 mmol/L   Glucose, Bld 173 (H) 70 - 99 mg/dL    Comment: Glucose reference range applies only to samples taken after fasting for at least 8 hours.   BUN 87 (H) 8 - 23 mg/dL   Creatinine, Ser 6.55 (H) 0.61 - 1.24 mg/dL   Calcium 8.2 (L) 8.9 - 10.3 mg/dL   GFR, Estimated 8 (L) >60 mL/min    Comment: (NOTE) Calculated using the CKD-EPI Creatinine Equation (2021)    Anion gap 17 (H) 5 - 15    Comment: Performed at [facility] [address], [place], [place] [address]  CBC     Status: None   Collection Time: 07/03/21   7:47 AM  Result Value Ref Range   WBC 9.6 4.0 - 10.5 K/uL   RBC 4.43 4.22 - 5.81 MIL/uL   Hemoglobin 14.2 13.0 - 17.0 g/dL   HCT 40.3 39.0 - 52.0 %   MCV 91.0 80.0 - 100.0 fL   MCH 32.1 26.0 - 34.0 pg   MCHC 35.2 30.0 - 36.0 g/dL   RDW 12.6 11.5 - 15.5 %   Platelets 239 150 - 400 K/uL   nRBC 0.0 0.0 - 0.2 %    Comment: Performed at [facility], [place] [address], [place], [place] [address]  Urinalysis, Routine w reflex microscopic Urine, Catheterized     Status: Abnormal   Collection Time: 07/03/21 12:14 PM  Result Value Ref Range   Color, Urine RED (A) YELLOW    Comment: BIOCHEMICALS MAY BE AFFECTED BY COLOR   APPearance TURBID (A) CLEAR   Specific Gravity, Urine  1.005 - 1.030    TEST NOT REPORTED DUE TO COLOR INTERFERENCE OF URINE PIGMENT   pH  5.0 - 8.0    TEST NOT REPORTED DUE TO COLOR INTERFERENCE OF URINE PIGMENT   Glucose, UA (A) NEGATIVE mg/dL    TEST NOT REPORTED DUE TO COLOR INTERFERENCE OF URINE PIGMENT   Hgb urine dipstick (A) NEGATIVE    TEST NOT REPORTED DUE TO COLOR INTERFERENCE OF URINE PIGMENT   Bilirubin Urine (A) NEGATIVE    TEST NOT REPORTED DUE TO COLOR INTERFERENCE OF URINE PIGMENT   Ketones, ur (A) NEGATIVE mg/dL    TEST NOT REPORTED DUE TO COLOR INTERFERENCE OF URINE PIGMENT   Protein, ur (A) NEGATIVE mg/dL    TEST NOT REPORTED DUE TO COLOR INTERFERENCE OF URINE PIGMENT   Nitrite (A) NEGATIVE    TEST NOT REPORTED DUE TO COLOR INTERFERENCE OF URINE PIGMENT   Leukocytes,Ua (A) NEGATIVE    TEST NOT REPORTED DUE TO COLOR INTERFERENCE OF URINE PIGMENT    Comment: Performed at [facility], [place] [address], [place], [place] [address]  Urinalysis, Microscopic (reflex)     Status: None   Collection Time: 07/03/21 12:14 PM  Result Value Ref Range   RBC / HPF >50 0 - 5 RBC/hpf   WBC, UA >50 0 - 5 WBC/hpf   Bacteria, UA NONE SEEN NONE SEEN   Squamous Epithelial / LPF NONE SEEN 0 - 5    Comment: Performed at [facility], [place] [address]  [place], [place] [address]  CK     Status: None   Collection Time: 07/03/21  2:17 PM  Result Value Ref Range   Total CK 180 49 - 397 U/L    Comment: Performed at [facility], [place] [address], [place], [place] [address]  Lactic acid, plasma     Status: Abnormal   Collection Time: 07/03/21  2:17 PM  Result Value Ref Range   Lactic Acid, Venous 2.3 (HH) 0.5 - 1.9 mmol/L    Comment: CRITICAL RESULT CALLED TO, READ BACK BY AND VERIFIED WITH: C ORTEGA RN BY SSTEPHENS [id number] Performed at [facility], [place] [address], [place], [place] [address]   Blood culture (routine x 2)     Status: None (Preliminary result)   Collection Time: 07/03/21  2:17 PM   Specimen: BLOOD  Result Value Ref Range   Specimen Description BLOOD RIGHT ANTECUBITAL    Special Requests      BOTTLES DRAWN AEROBIC AND ANAEROBIC Blood Culture adequate volume   Culture      NO GROWTH < 24 HOURS Performed at [facility], [place] [address], [place], [place] [address]    Report Status PENDING   Lactic acid, plasma     Status: None   Collection Time: 07/03/21  3:54 PM  Result Value Ref Range   Lactic Acid, Venous 1.4 0.5 - 1.9 mmol/L    Comment: Performed at [facility], [place] [address] [place], [place] [address]  CBC     Status: Abnormal   Collection Time: 07/04/21  4:25 AM  Result Value Ref Range   WBC 11.3 (H) 4.0 - 10.5 K/uL   RBC 3.64 (L) 4.22 - 5.81 MIL/uL   Hemoglobin 11.6 (L) 13.0 - 17.0 g/dL   HCT 33.2 (L) 39.0 - 52.0 %   MCV 91.2 80.0 - 100.0 fL   MCH 31.9 26.0 - 34.0 pg   MCHC 34.9 30.0 - 36.0 g/dL   RDW 12.9 11.5 - 15.5 %   Platelets 196 150 - 400 K/uL   nRBC 0.0 0.0 - 0.2 %    Comment: Performed at [facility], [place] [address], [place], [place] [address]  Comprehensive metabolic panel     Status: Abnormal   Collection Time: 07/04/21  4:25 AM  Result Value Ref Range   Sodium 141 135 - 145 mmol/L   Potassium 3.5 3.5 - 5.1 mmol/L   Chloride 112 (H) 98 - 111 mmol/L   CO2 19  (L) 22 - 32 mmol/L   Glucose, Bld 118 (H) 70 - 99 mg/dL    Comment: Glucose reference range applies only to samples taken after fasting for at least 8 hours.   BUN 50 (H) 8 - 23 mg/dL   Creatinine, Ser 2.32 (H) 0.61 - 1.24 mg/dL    Comment: DELTA CHECK NOTED   Calcium 7.3 (L) 8.9 - 10.3 mg/dL   Total Protein 5.3 (L) 6.5 - 8.1 g/dL   Albumin 2.4 (L) 3.5 - 5.0 g/dL   AST 25 15 - 41 U/L   ALT 35 0 - 44 U/L   Alkaline Phosphatase 66 38 - 126 U/L   Total Bilirubin 0.6 0.3 - 1.2 mg/dL   GFR, Estimated 28 (L) >60 mL/min    Comment: (NOTE) Calculated using the CKD-EPI Creatinine Equation (2021)    Anion gap 10 5 - 15    Comment: Performed at [facility] [address], [place],  [address]  Urinalysis, Routine w reflex microscopic     Status: Abnormal   Collection Time: 07/04/21  8:40 AM  Result Value Ref Range   Color, Urine YELLOW YELLOW   APPearance CLOUDY (A) CLEAR   Specific Gravity, Urine 1.013 1.005 - 1.030   pH 5.0 5.0 - 8.0   Glucose, UA NEGATIVE NEGATIVE mg/dL   Hgb urine dipstick LARGE (A) NEGATIVE   Bilirubin Urine NEGATIVE NEGATIVE   Ketones, ur NEGATIVE NEGATIVE mg/dL   Protein, ur 30 (A) NEGATIVE mg/dL   Nitrite NEGATIVE NEGATIVE   Leukocytes,Ua LARGE (A) NEGATIVE   RBC / HPF >50 (H) 0 - 5 RBC/hpf   WBC, UA 21-50 0 - 5 WBC/hpf   Bacteria, UA RARE (A) NONE SEEN   Squamous Epithelial / LPF 0-5 0 - 5   Mucus PRESENT     Comment: Performed at [facility] [address], [place], [place] [address]  Resp Panel by RT-PCR (Flu A&B, Covid) Nasopharyngeal Swab     Status: None   Collection Time: 07/04/21  2:23 PM   Specimen: Nasopharyngeal Swab; Nasopharyngeal(NP) swabs in vial transport medium  Result Value Ref Range   SARS Coronavirus 2 by RT PCR NEGATIVE NEGATIVE    Comment: (NOTE) SARS-CoV-2 target nucleic acids are NOT DETECTED.  The SARS-CoV-2 RNA is generally detectable in upper respiratory specimens during the acute phase of infection. The  lowest concentration of SARS-CoV-2 viral copies this assay can detect is 138 copies/mL. A negative result does not preclude SARS-Cov-2 infection and should not be used as the sole basis for treatment or other patient management decisions. A negative result may occur with  improper specimen collection/handling, submission of specimen other than nasopharyngeal swab, presence of viral mutation(s) within the areas targeted by this assay, and inadequate number of viral copies(<138 copies/mL). A negative result must be combined with clinical observations, patient history, and epidemiological information. The expected result is Negative.  Fact Sheet for Patients:  EntrepreneurPulse.com.au  Fact Sheet for Healthcare Providers:  IncredibleEmployment.be  This test is no t yet approved or cleared by the [place] FDA and  has been authorized for detection and/or diagnosis of SARS-CoV-2 by FDA under an Emergency Use Authorization (EUA). This EUA will remain  in effect (meaning this test can be used) for the duration of the COVID-19 declaration under Section 564(b)(1) of the Act, 21 U.S.C.section 360bbb-3(b)(1), unless the authorization is terminated  or revoked sooner.       Influenza A by PCR NEGATIVE NEGATIVE   Influenza B by PCR NEGATIVE NEGATIVE    Comment: (NOTE) The Xpert Xpress SARS-CoV-2/FLU/RSV plus assay is intended as an aid in the diagnosis of influenza from Nasopharyngeal swab specimens and should not be used as a sole basis for treatment. Nasal washings and aspirates are unacceptable for Xpert Xpress SARS-CoV-2/FLU/RSV testing.  Fact Sheet for Patients: EntrepreneurPulse.com.au  Fact Sheet for Healthcare Providers: IncredibleEmployment.be  This test is not yet approved or cleared by the [place] FDA and has been authorized for detection and/or diagnosis of SARS-CoV-2 by FDA under an Emergency  Use Authorization (EUA). This EUA will remain in effect (meaning this test can be used) for the duration of the COVID-19 declaration under Section 564(b)(1) of the Act, 21 U.S.C. section 360bbb-3(b)(1), unless the authorization is terminated or revoked.  Performed at [facility], [place] [address], [place], [place] [address]    CT ABDOMEN PELVIS WO CONTRAST  Addendum Date: 07/03/2021   ADDENDUM REPORT: 07/03/2021 17:01 IMPRESSION: 1. Numerous bladder calculi are present. 2. Heterogeneous hyperdensity in the bladder suspicious for hemorrhage measuring 7.0  x 5.6 x 8.1 cm. Bladder neoplasm can not be excluded. 3. Bladder is distended despite Foley catheter placement. There is mild bilateral hydroureteronephrosis. 4. Marked prostatomegaly. 5. There are pulmonary nodules in the left lower lobe measuring up to 5 mm. Consider follow-up dedicated chest CT for further evaluation. No follow-up needed if patient is low-risk (and has no known or suspected primary neoplasm). Non-contrast chest CT can be considered in 12 months if patient is high-risk. This recommendation follows the consensus statement: Guidelines for Management of Incidental Pulmonary Nodules Detected on CT Images: From the Fleischner Society 2017; Radiology 2017; 284:228-243. Electronically Signed   By: Ronney Asters M.D.   On: 07/03/2021 17:01   Result Date: 07/03/2021 CLINICAL DATA:  Acute abdominal pain.  Fall 1 week ago. EXAM: CT ABDOMEN AND PELVIS WITHOUT CONTRAST TECHNIQUE: Multidetector CT imaging of the abdomen and pelvis was performed following the standard protocol without IV contrast. COMPARISON:  None. FINDINGS: Lower chest: There is a 5 mm left lower lobe pulmonary nodule. Scratch there are 2 pulmonary nodules in the scratch there are 3 pulmonary nodules in the visualized left lower lobe measuring up to 5 mm. There is some tree-in-bud opacities in the left lung base, likely infectious/inflammatory. Hepatobiliary: No focal liver  abnormality is seen. No gallstones, gallbladder wall thickening, or biliary dilatation. Pancreas: Unremarkable. No pancreatic ductal dilatation or surrounding inflammatory changes. Spleen: Normal in size without focal abnormality. Adrenals/Urinary Tract: There is mild bilateral hydronephrosis to the level of the bladder. No obstructing calculi are seen. No focal renal lesions are identified. The adrenal glands are within normal limits. Foley catheter is in the bladder. There is heterogeneous mild hyperdensity in the bladder suspicious for bladder mass versus hemorrhage measuring 7.5 by 5.0 by 4.3 cm. There are numerous bladder calculi measuring up to 15 mm. There is no inflammation surrounding the bladder. Small amount of air in the bladder is likely related to Foley catheter placement. Stomach/Bowel: No evidence of bowel wall thickening, distention, or inflammatory changes. The appendix is not visualized. There is a small hiatal hernia. Stomach is otherwise within normal limits. Vascular/Lymphatic: Aortic atherosclerosis. No enlarged abdominal or pelvic lymph nodes. Reproductive: Prostate gland is enlarged measuring 7.0 x 5.6 by 8.1 cm. Other: No abdominal wall hernia or abnormality. No abdominopelvic ascites. Musculoskeletal: Degenerative changes affect the spine and hips. No acute fractures are seen. IMPRESSION: 1.  Numerous bladder calculi are present. 2. Heterogeneous hyperdensity in the bladder suspicious for hemorrhage measuring 7.0 x 5.6 x 8.1 cm. Bladder neoplasm can not be excluded. 3. Bladder is distended despite Foley catheter placement. There is mild bilateral hydroureteronephrosis. 4.  Marked prostatomegaly. Electronically Signed: By: Ronney Asters M.D. On: 07/03/2021 16:53   US Renal  Result Date: 07/03/2021 CLINICAL DATA:  Initial evaluation for hematuria, urinary retention. EXAM: RENAL / URINARY TRACT ULTRASOUND COMPLETE COMPARISON:  Prior CT from 06/23/2021. FINDINGS: Right Kidney: Renal  measurements: 11.9 x 5.2 x 6.9 cm = volume: 224.8 mL. Renal echogenicity within normal limits. No nephrolithiasis or hydronephrosis. No focal renal mass. Left Kidney: Renal measurements: 11.2 x 5.1 x 6.2 cm = volume: 187.0 mL. Renal echogenicity within normal limits. No nephrolithiasis. Mild pelviectasis without overt hydronephrosis. No focal renal mass. Bladder: Not assessed on this examination. Other: None. IMPRESSION: 1. Mild left renal pelviectasis without overt hydronephrosis. 2. Otherwise unremarkable and normal renal ultrasound. Electronically Signed   By: Jeannine Boga M.D.   On: 07/03/2021 19:30    Pending Labs Unresulted Labs (From admission, onward)  Start     Ordered   07/05/21 [id number]  Basic metabolic panel  Tomorrow morning,   R        07/04/21 1128   07/05/21 0500  CBC  Tomorrow morning,   R        07/04/21 1128            Vitals/Pain Today's Vitals   07/04/21 0730 07/04/21 1030 07/04/21 1530 07/04/21 1615  BP: (!) 154/69 (!) 156/63 (!) 157/60 (!) 161/64  Pulse: 80 76 68 79  Resp: (!) 21 18 18 18   Temp:      TempSrc:      SpO2: 93% 100% 100% 100%  PainSc:        Isolation Precautions No active isolations  Medications Medications  sodium chloride flush (NS) 0.9 % injection 3 mL (3 mLs Intravenous Given 07/04/21 0953)  acetaminophen (TYLENOL) tablet 650 mg (has no administration in time range)    Or  acetaminophen (TYLENOL) suppository 650 mg (has no administration in time range)  polyethylene glycol (MIRALAX / GLYCOLAX) packet 17 g (has no administration in time range)  cefTRIAXone (ROCEPHIN) 2 g in sodium chloride 0.9 % 100 mL IVPB (0 g Intravenous Stopped 07/04/21 1024)  sodium bicarbonate 150 mEq in dextrose 5 % 1,150 mL infusion ( Intravenous New Bag/Given 07/04/21 1026)  finasteride (PROSCAR) tablet 5 mg (has no administration in time range)  cefTRIAXone (ROCEPHIN) 2 g in sodium chloride 0.9 % 100 mL IVPB (0 g Intravenous Stopped 07/03/21 1928)  sodium  chloride 0.9 % bolus 2,000 mL (0 mLs Intravenous Stopped 07/04/21 0114)    Mobility walks Low fall risk   Focused Assessments   R Recommendations: See Admitting Provider Note  Report given to:   Additional Notes:

## 2021-07-04 NOTE — Progress Notes (Signed)
[facility] Progress Note  Subjective: creat down 2.4 today, BP's good, on RA, no c/o's.  Seen by urology, doesn't need CBI at this time.    Vitals:   07/04/21 0600 07/04/21 0700 07/04/21 0730 07/04/21 1030  BP: (!) 144/60 (!) 156/70 (!) 154/69 (!) 156/63  Pulse: 76 75 80 76  Resp: 20 20 (!) 21 18  Temp:      TempSrc:      SpO2: 97% 99% 93% 100%    Exam:   alert, nad   no jvd   Mouth less dry  Chest cta bilat  Cor reg no RG  Abd soft ntnd no ascites  GU foley in place, good UOP   Ext no LE edema   Alert, NF, ox3    Home meds include - none       UA turbid, red color, > 50 rbc/ wbc per hpf, other tests not valid due to severe hematuria     CT abd w/o contrast 12/31 - IMPRESSION: 1. Numerous bladder calculi are present.  2. Heterogeneous hyperdensity in the bladder suspicious for hemorrhage measuring 7.0 x 5.6 x 8.1 cm. Bladder neoplasm can not be excluded. 3. Bladder is distended despite Foley catheter placement. There is mild bilateral hydroureteronephrosis. 4. Marked prostatomegaly 5. There are pulmonary nodules in the left lower lobe measuring up to 5 mm.       Assessment/ Plan: Renal failure - most likely acute renal failure due to obstruction. No old labs. Pt presented w/ hematuria x 2wk and creat 6.5 with CT showing bilat hydronephrosis, large clot in bladder and markedly enlarged prostate. After foley placed in ED 2 L of urine was passed. IVF's were given. Overnight UOP was very good and creat down to 2.3 today. Seen by urology who will plan TURP at some point or other procedure to treat outlet obstruction.  Would cont IVF"s for now 75- 100 cc/hr, wean off per pmd. AKI should continue to recover. No other suggestions. Will sign off.   Hematuria/ urinary retention - per urology Volume depletion - as above          Blake Medina 07/04/2021, 11:20 AM   Recent Labs  Lab 07/03/21 0747 07/04/21 0425  K 4.0 3.5  BUN 87* 50*  CREATININE 6.55* 2.32*   CALCIUM 8.2* 7.3*  HGB 14.2 11.6*   Inpatient medications:  sodium chloride flush  3 mL Intravenous Q12H    cefTRIAXone (ROCEPHIN)  IV Stopped (07/04/21 1024)   sodium bicarbonate 150 mEq in D5W infusion 100 mL/hr at 07/04/21 1026   acetaminophen **OR** acetaminophen, polyethylene glycol

## 2021-07-04 NOTE — Progress Notes (Signed)
Urology Inpatient Progress Report  Acute renal failure (ARF) (HCC) [N17.9] AKI (acute kidney injury) ([place]) [N17.9]        Intv/Subj: No acute events overnight. Patient is without complaint. Urine clear yellow  Principal Problem:   Acute renal failure (ARF) (HCC) Active Problems:   Acute lower UTI   Obstructive uropathy   AKI (acute kidney injury) ([place])  Current Facility-Administered Medications  Medication Dose Route Frequency Provider Last Rate Last Admin   acetaminophen (TYLENOL) tablet 650 mg  650 mg Oral Q6H PRN Marcelyn Bruins, MD       Or   acetaminophen (TYLENOL) suppository 650 mg  650 mg Rectal Q6H PRN Marcelyn Bruins, MD       cefTRIAXone (ROCEPHIN) 2 g in sodium chloride 0.9 % 100 mL IVPB  2 g Intravenous Q24H Marcelyn Bruins, MD   Stopped at 07/04/21 1024   polyethylene glycol (MIRALAX / GLYCOLAX) packet 17 g  17 g Oral Daily PRN Marcelyn Bruins, MD       sodium bicarbonate 150 mEq in dextrose 5 % 1,150 mL infusion   Intravenous Continuous Roney Jaffe, MD 100 mL/hr at 07/04/21 1026 New Bag at 07/04/21 1026   sodium chloride flush (NS) 0.9 % injection 3 mL  3 mL Intravenous Q12H Marcelyn Bruins, MD   3 mL at 07/04/21 [id number]   Current Outpatient Medications  Medication Sig Dispense Refill   aspirin 500 MG tablet Take 500 mg by mouth daily.     OVER THE COUNTER MEDICATION Take 1 tablet by mouth daily. Prosvent       Objective: Vital: Vitals:   07/04/21 0600 07/04/21 0700 07/04/21 0730 07/04/21 1030  BP: (!) 144/60 (!) 156/70 (!) 154/69 (!) 156/63  Pulse: 76 75 80 76  Resp: 20 20 (!) 21 18  Temp:      TempSrc:      SpO2: 97% 99% 93% 100%   I/Os: I/O last 3 completed shifts: In: 2103.8 [IV Piggyback:2103.8] Out: 4200 [Urine:4200]  Physical Exam:  General: Patient is in no apparent distress Lungs: Normal respiratory effort, chest expands symmetrically. GI: The abdomen is soft and nontender without mass. Foley: 3 way foley draining  clear yellow urine  Ext: lower extremities symmetric  Lab Results: Recent Labs    07/03/21 0747 07/04/21 0425  WBC 9.6 11.3*  HGB 14.2 11.6*  HCT 40.3 33.2*   Recent Labs    07/03/21 0747 07/04/21 0425  NA 137 141  K 4.0 3.5  CL 101 112*  CO2 19* 19*  GLUCOSE 173* 118*  BUN 87* 50*  CREATININE 6.55* 2.32*  CALCIUM 8.2* 7.3*   No results for input(s): LABPT, INR in the last 72 hours. No results for input(s): LABURIN in the last 72 hours. Results for orders placed or performed during the hospital encounter of 07/03/21  Blood culture (routine x 2)     Status: None (Preliminary result)   Collection Time: 07/03/21  7:21 AM   Specimen: BLOOD  Result Value Ref Range Status   Specimen Description BLOOD SITE NOT SPECIFIED  Final   Special Requests   Final    BOTTLES DRAWN AEROBIC AND ANAEROBIC Blood Culture adequate volume   Culture   Final    NO GROWTH < 24 HOURS Performed at [facility], [place] [address], [place], [place] [address]    Report Status PENDING  Incomplete  Blood culture (routine x 2)     Status: None (Preliminary result)  Collection Time: 07/03/21  2:17 PM   Specimen: BLOOD  Result Value Ref Range Status   Specimen Description BLOOD RIGHT ANTECUBITAL  Final   Special Requests   Final    BOTTLES DRAWN AEROBIC AND ANAEROBIC Blood Culture adequate volume   Culture   Final    NO GROWTH < 24 HOURS Performed at [facility], [place] [address], [place], [place] [address]    Report Status PENDING  Incomplete    Studies/Results: CT ABDOMEN PELVIS WO CONTRAST  Addendum Date: 07/03/2021   ADDENDUM REPORT: 07/03/2021 17:01 IMPRESSION: 1. Numerous bladder calculi are present. 2. Heterogeneous hyperdensity in the bladder suspicious for hemorrhage measuring 7.0 x 5.6 x 8.1 cm. Bladder neoplasm can not be excluded. 3. Bladder is distended despite Foley catheter placement. There is mild bilateral hydroureteronephrosis. 4. Marked prostatomegaly. 5. There are  pulmonary nodules in the left lower lobe measuring up to 5 mm. Consider follow-up dedicated chest CT for further evaluation. No follow-up needed if patient is low-risk (and has no known or suspected primary neoplasm). Non-contrast chest CT can be considered in 12 months if patient is high-risk. This recommendation follows the consensus statement: Guidelines for Management of Incidental Pulmonary Nodules Detected on CT Images: From the Fleischner Society 2017; Radiology 2017; 284:228-243. Electronically Signed   By: Ronney Asters M.D.   On: 07/03/2021 17:01   Result Date: 07/03/2021 CLINICAL DATA:  Acute abdominal pain.  Fall 1 week ago. EXAM: CT ABDOMEN AND PELVIS WITHOUT CONTRAST TECHNIQUE: Multidetector CT imaging of the abdomen and pelvis was performed following the standard protocol without IV contrast. COMPARISON:  None. FINDINGS: Lower chest: There is a 5 mm left lower lobe pulmonary nodule. Scratch there are 2 pulmonary nodules in the scratch there are 3 pulmonary nodules in the visualized left lower lobe measuring up to 5 mm. There is some tree-in-bud opacities in the left lung base, likely infectious/inflammatory. Hepatobiliary: No focal liver abnormality is seen. No gallstones, gallbladder wall thickening, or biliary dilatation. Pancreas: Unremarkable. No pancreatic ductal dilatation or surrounding inflammatory changes. Spleen: Normal in size without focal abnormality. Adrenals/Urinary Tract: There is mild bilateral hydronephrosis to the level of the bladder. No obstructing calculi are seen. No focal renal lesions are identified. The adrenal glands are within normal limits. Foley catheter is in the bladder. There is heterogeneous mild hyperdensity in the bladder suspicious for bladder mass versus hemorrhage measuring 7.5 by 5.0 by 4.3 cm. There are numerous bladder calculi measuring up to 15 mm. There is no inflammation surrounding the bladder. Small amount of air in the bladder is likely related to  Foley catheter placement. Stomach/Bowel: No evidence of bowel wall thickening, distention, or inflammatory changes. The appendix is not visualized. There is a small hiatal hernia. Stomach is otherwise within normal limits. Vascular/Lymphatic: Aortic atherosclerosis. No enlarged abdominal or pelvic lymph nodes. Reproductive: Prostate gland is enlarged measuring 7.0 x 5.6 by 8.1 cm. Other: No abdominal wall hernia or abnormality. No abdominopelvic ascites. Musculoskeletal: Degenerative changes affect the spine and hips. No acute fractures are seen. IMPRESSION: 1.  Numerous bladder calculi are present. 2. Heterogeneous hyperdensity in the bladder suspicious for hemorrhage measuring 7.0 x 5.6 x 8.1 cm. Bladder neoplasm can not be excluded. 3. Bladder is distended despite Foley catheter placement. There is mild bilateral hydroureteronephrosis. 4.  Marked prostatomegaly. Electronically Signed: By: Ronney Asters M.D. On: 07/03/2021 16:53   US Renal  Result Date: 07/03/2021 CLINICAL DATA:  Initial evaluation for hematuria, urinary retention. EXAM: RENAL /  URINARY TRACT ULTRASOUND COMPLETE COMPARISON:  Prior CT from 06/23/2021. FINDINGS: Right Kidney: Renal measurements: 11.9 x 5.2 x 6.9 cm = volume: 224.8 mL. Renal echogenicity within normal limits. No nephrolithiasis or hydronephrosis. No focal renal mass. Left Kidney: Renal measurements: 11.2 x 5.1 x 6.2 cm = volume: 187.0 mL. Renal echogenicity within normal limits. No nephrolithiasis. Mild pelviectasis without overt hydronephrosis. No focal renal mass. Bladder: Not assessed on this examination. Other: None. IMPRESSION: 1. Mild left renal pelviectasis without overt hydronephrosis. 2. Otherwise unremarkable and normal renal ultrasound. Electronically Signed   By: Jeannine Boga M.D.   On: 07/03/2021 19:30    Assessment: Urinary retention Gross hematuria Acute renal failure, unknown baseline  Plan: Continue foley catheter. I added finasteride which can  help with prostatic bleeding. He will need to f/u outpatient for cystoscopy and surgical planning.    Link Snuffer, MD Urology 07/04/2021, 1:58 PM

## 2021-07-04 NOTE — Progress Notes (Signed)
New Admission Note:   Arrival Method: Stretcher  Mental Orientation: Alert and oriented  Telemetry: [address]  Assessment: Completed Skin: flea bites  HU:TMLYY AC  Pain: 0/10  Tubes: none  Safety Measures: Safety Fall Prevention Plan has been given, discussed and signed Admission: Completed [facility] Orientation: Patient has been orientated to the room, unit and staff.  Family: none   Orders have been reviewed and implemented. Will continue to monitor the patient. Call light has been placed within reach and bed alarm has been activated.   Domenique Quest RN [facility] Phone: [phone]

## 2021-07-04 NOTE — Assessment & Plan Note (Signed)
Most likely cause of patient's urinary retention. Currently has a Foley catheter, on Flomax as well as on finasteride.

## 2021-07-04 NOTE — Assessment & Plan Note (Signed)
Secondary to BPH. Possibility of a UTI association cannot be ruled out as well. Currently on IV antibiotics.

## 2021-07-04 NOTE — Assessment & Plan Note (Signed)
Secondary to acute urinary retention and obstructive uropathy. Urology consulted. Currently Foley catheter placed.  Reviewed ultrasound negative for any hydronephrosis.

## 2021-07-04 NOTE — Assessment & Plan Note (Signed)
Blood pressure elevated today without any symptoms. Will monitor for now. Patient has not seen any provider in a while therefore suspect this is chronic.

## 2021-07-04 NOTE — Assessment & Plan Note (Signed)
Presents with a fall and generalized weakness as well as constipation.  Reported peeing blood for last 1 week. Had significant retention on admission. Creatinine 6.5 with metabolic acidosis and uremia. Nephrology was consulted. Neurology was consulted. Patient was hydrated aggressively. After Foley catheter placement serum creatinine improving significantly.  Nephrology currently signed off.  Monitor.

## 2021-07-04 NOTE — Assessment & Plan Note (Signed)
In the setting of severe AKI as well as dehydration. Currently improving with IV hydration. Patient was given bicarb infusion. Monitor.

## 2021-07-04 NOTE — Progress Notes (Signed)
°  Progress Note   Patient: Blake Medina [id number] DOB: July 17, 1945 DOA: 07/03/2021     0 DOS: the patient was seen and examined on 07/04/2021   Brief hospital course: No notes on file  Assessment and Plan * Acute renal failure (ARF) ([place])- (present on admission) Presents with a fall and generalized weakness as well as constipation.  Reported peeing blood for last 1 week. Had significant retention on admission. Creatinine 6.5 with metabolic acidosis and uremia. Nephrology was consulted. Neurology was consulted. Patient was hydrated aggressively. After Foley catheter placement serum creatinine improving significantly.  Nephrology currently signed off.  Monitor.  Bilateral hydronephrosis- (present on admission) Secondary to acute urinary retention and obstructive uropathy. Urology consulted. Currently Foley catheter placed.  Reviewed ultrasound negative for any hydronephrosis.  BPH (benign prostatic hyperplasia)- (present on admission) Most likely cause of patient's urinary retention. Currently has a Foley catheter, on Flomax as well as on finasteride.  Hematuria- (present on admission) Associated with BPH and retention. Per urology no indication for CBI. Recommend regular bladder irrigation. Urine currently clear.   Obstructive uropathy- (present on admission) Secondary to BPH. Possibility of a UTI association cannot be ruled out as well. Currently on IV antibiotics.  Acute lower UTI- (present on admission) Follow-up on cultures. Currently on IV ceftriaxone.  Hypertensive urgency- (present on admission) Blood pressure elevated today without any symptoms. Will monitor for now. Patient has not seen any provider in a while therefore suspect this is chronic.  Metabolic acidosis- (present on admission) In the setting of severe AKI as well as dehydration. Currently improving with IV hydration. Patient was given bicarb infusion. Monitor.     Subjective: No nausea no  vomiting no fever no chills.  Still has some blood around his perineal area from prior bleeding.  No abdominal pain.  Objective Vital signs were reviewed and unremarkable. General: Appear in mild distress, no Rash; Oral Mucosa Clear, moist. no Abnormal Neck Mass Or lumps, Conjunctiva normal  Cardiovascular: S1 and S2 Present, no Murmur, Respiratory: good respiratory effort, Bilateral Air entry present and CTA, no Crackles, no wheezes Abdomen: Bowel Sound present, Soft and no tenderness Extremities: no Pedal edema Neurology: alert and oriented to time, place, and person affect appropriate. no new focal deficit Gait not checked due to patient safety concerns   Data Reviewed: Improving renal function, improving metabolic acidosis.  Drop in hemoglobin.  Family Communication: None at bedside.  Disposition: Status is: Inpatient  Remains inpatient appropriate because: Ongoing need for IV hydration as well as monitoring for anemia and AKI.         Time spent: 35 minutes  Author: Berle Mull 07/04/2021 5:35 PM  For on call review [email or web address].

## 2021-07-04 NOTE — Assessment & Plan Note (Signed)
Follow-up on cultures. Currently on IV ceftriaxone.

## 2021-07-04 NOTE — Assessment & Plan Note (Signed)
Associated with BPH and retention. Per urology no indication for CBI. Recommend regular bladder irrigation. Urine currently clear.

## 2021-07-05 DIAGNOSIS — N179 Acute kidney failure, unspecified: Secondary | ICD-10-CM | POA: Diagnosis not present

## 2021-07-05 LAB — CBC
HCT: 31 % — ABNORMAL LOW (ref 39.0–52.0)
Hemoglobin: 10.7 g/dL — ABNORMAL LOW (ref 13.0–17.0)
MCH: 30.9 pg (ref 26.0–34.0)
MCHC: 34.5 g/dL (ref 30.0–36.0)
MCV: 89.6 fL (ref 80.0–100.0)
Platelets: 198 10*3/uL (ref 150–400)
RBC: 3.46 MIL/uL — ABNORMAL LOW (ref 4.22–5.81)
RDW: 12.9 % (ref 11.5–15.5)
WBC: 13.1 10*3/uL — ABNORMAL HIGH (ref 4.0–10.5)
nRBC: 0 % (ref 0.0–0.2)

## 2021-07-05 LAB — BASIC METABOLIC PANEL
Anion gap: 9 (ref 5–15)
BUN: 18 mg/dL (ref 8–23)
CO2: 27 mmol/L (ref 22–32)
Calcium: 7.2 mg/dL — ABNORMAL LOW (ref 8.9–10.3)
Chloride: 102 mmol/L (ref 98–111)
Creatinine, Ser: 1.12 mg/dL (ref 0.61–1.24)
GFR, Estimated: >60 mL/min (ref >60)
Glucose, Bld: 126 mg/dL — ABNORMAL HIGH (ref 70–99)
Potassium: 3 mmol/L — ABNORMAL LOW (ref 3.5–5.1)
Sodium: 138 mmol/L (ref 135–145)

## 2021-07-05 MED ORDER — MELATONIN 3 MG PO TABS
3.0000 mg | ORAL_TABLET | Freq: Every evening | ORAL | Status: DC | PRN
  Administered 2021-07-05: 3 mg via ORAL
  Filled 2021-07-05: qty 1

## 2021-07-05 MED ORDER — POTASSIUM CHLORIDE CRYS ER 20 MEQ PO TBCR
40.0000 meq | EXTENDED_RELEASE_TABLET | Freq: Once | ORAL | Status: AC
  Administered 2021-07-05: 40 meq via ORAL
  Filled 2021-07-05: qty 2

## 2021-07-05 MED ORDER — ENSURE ENLIVE PO LIQD
237.0000 mL | Freq: Two times a day (BID) | ORAL | Status: DC
  Administered 2021-07-05 – 2021-07-06 (×3): 237 mL via ORAL

## 2021-07-05 MED ORDER — POLYETHYLENE GLYCOL 3350 17 G PO PACK
17.0000 g | PACK | Freq: Every day | ORAL | Status: DC
  Filled 2021-07-05 (×2): qty 1

## 2021-07-05 MED ORDER — SODIUM CHLORIDE 0.9 % IV SOLN: INTRAVENOUS | Status: DC

## 2021-07-05 MED ORDER — POTASSIUM CHLORIDE CRYS ER 20 MEQ PO TBCR
20.0000 meq | EXTENDED_RELEASE_TABLET | Freq: Once | ORAL | Status: AC
  Administered 2021-07-05: 20 meq via ORAL
  Filled 2021-07-05: qty 1

## 2021-07-05 NOTE — Evaluation (Signed)
Occupational Therapy Evaluation Patient Details Name: Blake Medina MRN: [id number] DOB: September 24, 1945 Today's Date: 07/05/2021   History of Present Illness Pt is a 76 y.o. M who presents 07/03/2021 after a fall and generalized weakness. Reports peeing blood for last week. Found to have ARF, bilateral hydronephrosis, obstructive uropathy, acute lower UTI, HTN urgency, metabolic acidosis. Pt was hydrated aggressively and Foley catheter was placed. Creatinine improved significantly and nephrology signed off. No pertinent PMH.   Clinical Impression   PTA, pt lives alone and reports typically Independent with ADLs, household IADLs and mobility though has intermittent assist for groceries as pt does not drive. Pt presents now with deficits in strength, endurance, and standing balance with new reliance on DME for mobility. Pt requires min guard for mobility using RW (intermittent Min A to stand), Setup for UB ADLs and up to Mod A for LB ADLs. Pt endorses fatigue after basic ADLs in room, questions how he would be able to manage making a meal at home with current deficits. Plan to further address LB ADLs with AE education and progression of endurance. Recommend SNF rehab at this time as pt below baseline and has limited support at DC. Will continue to follow acutely as pt may progress to [facility] levels.     Recommendations for follow up therapy are one component of a multi-disciplinary discharge planning process, led by the attending physician.  Recommendations may be updated based on patient status, additional functional criteria and insurance authorization.   Follow Up Recommendations  Skilled nursing-short term rehab (<3 hours/day)    Assistance Recommended at Discharge Intermittent Supervision/Assistance  Functional Status Assessment  Patient has had a recent decline in their functional status and demonstrates the ability to make significant improvements in function in a reasonable and predictable amount of  time.  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 Restrictions Weight Bearing Restrictions: No      Mobility Bed Mobility Overal bed mobility: Needs Assistance Bed Mobility: Supine to Sit     Supine to sit: Min assist     General bed mobility comments: received in chair    Transfers Overall transfer level: Needs assistance Equipment used: Rolling walker (2 wheels) Transfers: Sit to/from Stand Sit to Stand: Min assist           General transfer comment: MIn A for initial sit to stand from recliner, progressed to min guard pushing from [facility] armrests      Balance Overall balance assessment: Needs assistance Sitting-balance support: Feet supported Sitting balance-Leahy Scale: Good     Standing balance support: Bilateral upper extremity supported;Reliant on assistive device for balance Standing balance-Leahy Scale: Fair Standing balance comment: fair static standing at sink but benefits from BUE support for mobility                           ADL either performed or assessed with clinical judgement   ADL Overall ADL's : Needs assistance/impaired Eating/Feeding: Independent;Sitting   Grooming: Wash/dry face;Wash/dry hands;Standing;Min guard;Brushing hair Grooming Details (indicate cue type and reason): able to complete tasks with intermittent one UE support standing at sink. OT assisted in washing hair to minimize mess with pt able to brush hair while seated Upper Body Bathing: Set up;Sitting   Lower Body Bathing: Moderate assistance;Sit to/from stand   Upper Body Dressing :  Set up;Sitting   Lower Body Dressing: Moderate assistance;Sit to/from stand Lower Body Dressing Details (indicate cue type and reason): unable to cross LEs, difficulty reaching to don  clothing recently at home Toilet Transfer: Min guard;Ambulation;Rolling walker (2 wheels)   Toileting- Clothing Manipulation and Hygiene: Moderate assistance;Sit to/from stand Toileting - Clothing Manipulation Details (indicate cue type and reason): able to attempt peri care on [facility] though difficult to reach. 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 (can see some light); R eye about "50%" impaired; reports unsure if glaucoma, cataracts, etc when asked     Perception     Praxis      Pertinent Vitals/Pain Pain Assessment: Faces Faces Pain Scale: Hurts a little bit Pain Location: L hip Pain Descriptors / Indicators: Sore Pain Intervention(s): Limited activity within patient's tolerance;Monitored during session     Hand Dominance Right   Extremity/Trunk Assessment Upper Extremity Assessment Upper Extremity Assessment: Generalized weakness   Lower Extremity Assessment Lower Extremity Assessment: Defer to PT evaluation   Cervical / Trunk Assessment Cervical / Trunk Assessment: Normal   Communication Communication Communication: No difficulties   Cognition Arousal/Alertness: Awake/alert Behavior During Therapy: WFL for tasks assessed/performed Overall Cognitive Status: Within Functional Limits for tasks assessed                                       General Comments  Min blood around perineal area    Exercises     Shoulder Instructions      Home Living Family/patient  expects to be discharged to:: Private residence Living Arrangements: Alone Available Help at Discharge: Other (Comment) (none he can rely on (elderly aunts in area and cousin(?) with mental delay)) Type of Home: Mobile home Home Access: Stairs to enter Entrance Stairs-Number of Steps: 4   Home Layout: One level     Bathroom Shower/Tub: Producer, television/film/video: Standard     Home Equipment: BSC/3in1   Additional Comments: reports he has a BSC from another family member      Prior Functioning/Environment Prior Level of Function : Needs assist       Physical Assist : ADLs (physical)   ADLs (physical): IADLs Mobility Comments: no use of AD typically ADLs Comments: does not drive, makes neighbor a grocery list and he brings his groceries. Reports able to complete ADLs, basic household IADLs but more difficult leading up to admission. Reports being more active before his dog passed away        OT Problem List: Decreased strength;Decreased activity tolerance;Impaired balance (sitting and/or standing);Decreased knowledge of use of DME or AE      OT Treatment/Interventions: Self-care/ADL training;Therapeutic exercise;DME and/or AE instruction;Energy conservation;Therapeutic activities;Patient/family education;Balance training    OT Goals(Current goals can be found in the care plan section) Acute Rehab OT Goals Patient Stated Goal: regain strength, feels like he would need rehab if leaving today but hopeful to progress to home OT Goal Formulation: With patient Time For Goal Achievement:  07/19/21 Potential to Achieve Goals: Good  OT Frequency: Min 2X/week   Barriers to D/C:            Co-evaluation              AM-PAC OT "6 Clicks" Daily Activity     Outcome Measure Help from another person eating meals?: None Help from another person taking care of personal grooming?: A Little Help from another person toileting, which includes using toliet, bedpan, or  urinal?: A Lot Help from another person bathing (including washing, rinsing, drying)?: A Lot Help from another person to put on and taking off regular upper body clothing?: A Little Help from another person to put on and taking off regular lower body clothing?: A Lot 6 Click Score: 16   End of Session Equipment Utilized During Treatment: Rolling walker (2 wheels) Nurse Communication: Mobility status  Activity Tolerance: Patient tolerated treatment well Patient left: in chair;with call bell/phone within reach;with chair alarm set;with nursing/sitter in room  OT Visit Diagnosis: Other abnormalities of gait and mobility (R26.89);Unsteadiness on feet (R26.81);Muscle weakness (generalized) (M62.81)                Time: 4315-4008 OT Time Calculation (min): 31 min Charges:  OT General Charges $OT Visit: 1 Visit OT Evaluation $OT Eval Moderate Complexity: 1 Mod OT Treatments $Self Care/Home Management : 8-22 mins  Bradd Canary, OTR/L Acute Rehab Services Office: [phone]   Lorre Munroe 07/05/2021, 11:33 AM

## 2021-07-05 NOTE — Evaluation (Signed)
Physical Therapy Evaluation Patient Details Name: Blake Medina MRN: [id number] DOB: 1946-03-29 Today's Date: 07/05/2021  History of Present Illness  Pt is a 76 y.o. M who presents 07/03/2021 after a fall and generalized weakness. Reports peeing blood for last week. Found to have ARF, bilateral hydronephrosis, obstructive uropathy, acute lower UTI, HTN urgency, metabolic acidosis. Pt was hydrated aggressively and Foley catheter was placed. Creatinine improved significantly and nephrology signed off. No pertinent PMH.  Clinical Impression  PTA, pt lives alone and is independent. He does not drive; states his neighbor brings his groceries. Pt presents with generalized weakness, pain (back, left hip), decreased activity tolerance and impaired balance. Pt ambulating 80 feet with a walker at a min guard assist level. HR 80-120 bpm. Would benefit from HHPT follow up.     Recommendations for follow up therapy are one component of a multi-disciplinary discharge planning process, led by the attending physician.  Recommendations may be updated based on patient status, additional functional criteria and insurance authorization.  Follow Up Recommendations Home health PT    Assistance Recommended at Discharge PRN  Functional Status Assessment Patient has had a recent decline in their functional status and demonstrates the ability to make significant improvements in function in a reasonable and predictable amount of time.  Equipment Recommendations  Rolling walker (2 wheels)    Recommendations for Other Services       Precautions / Restrictions Precautions Precautions: Fall Restrictions Weight Bearing Restrictions: No      Mobility  Bed Mobility Overal bed mobility: Needs Assistance Bed Mobility: Supine to Sit     Supine to sit: Min assist     General bed mobility comments: Increased time, cues for initiation, minA at trunk to boost up to sitting    Transfers Overall transfer level: Needs  assistance Equipment used: Rolling walker (2 wheels) Transfers: Sit to/from Stand Sit to Stand: Min guard                Ambulation/Gait Ambulation/Gait assistance: Min guard Gait Distance (Feet): 80 Feet Assistive device: Rolling walker (2 wheels) Gait Pattern/deviations: Step-through pattern;Decreased stride length Gait velocity: decreased     General Gait Details: Cues for walker use and proximity, decreased bilateral foot clearance  Stairs            Wheelchair Mobility    Modified Rankin (Stroke Patients Only)       Balance Overall balance assessment: Needs assistance Sitting-balance support: Feet supported Sitting balance-Leahy Scale: Good     Standing balance support: Bilateral upper extremity supported;Reliant on assistive device for balance                                 Pertinent Vitals/Pain Pain Assessment: Faces Faces Pain Scale: Hurts a little bit Pain Location: L hip, back Pain Descriptors / Indicators: Sore Pain Intervention(s): Premedicated before session    Home Living Family/patient expects to be discharged to:: Private residence Living Arrangements: Alone Available Help at Discharge: Other (Comment) (Pt reports "none he can rely on") Type of Home: Mobile home Home Access: Stairs to enter   Entrance Stairs-Number of Steps: 4   Home Layout: One level Home Equipment: None      Prior Function Prior Level of Function : Needs assist             Mobility Comments: independent, although sounds like sedentary lifestyle ADLs Comments: does not drive, makes neighbor a grocery list and  he brings his groceries     Hand Dominance        Extremity/Trunk Assessment   Upper Extremity Assessment Upper Extremity Assessment: Defer to OT evaluation    Lower Extremity Assessment Lower Extremity Assessment: Generalized weakness    Cervical / Trunk Assessment Cervical / Trunk Assessment: Normal  Communication    Communication: No difficulties  Cognition Arousal/Alertness: Awake/alert Behavior During Therapy: WFL for tasks assessed/performed Overall Cognitive Status: Within Functional Limits for tasks assessed                                          General Comments General comments (skin integrity, edema, etc.): Min blood around perineal area    Exercises     Assessment/Plan    PT Assessment Patient needs continued PT services  PT Problem List Decreased strength;Decreased activity tolerance;Decreased balance;Decreased mobility       PT Treatment Interventions DME instruction;Gait training;Stair training;Functional mobility training;Therapeutic activities;Therapeutic exercise;Balance training;Patient/family education    PT Goals (Current goals can be found in the Care Plan section)  Acute Rehab PT Goals Patient Stated Goal: be more active PT Goal Formulation: With patient Time For Goal Achievement: 07/19/21 Potential to Achieve Goals: Good    Frequency Min 3X/week   Barriers to discharge        Co-evaluation               AM-PAC PT "6 Clicks" Mobility  Outcome Measure Help needed turning from your back to your side while in a flat bed without using bedrails?: None Help needed moving from lying on your back to sitting on the side of a flat bed without using bedrails?: A Little Help needed moving to and from a bed to a chair (including a wheelchair)?: A Little Help needed standing up from a chair using your arms (e.g., wheelchair or bedside chair)?: A Little Help needed to walk in hospital room?: A Little Help needed climbing 3-5 steps with a railing? : A Little 6 Click Score: 19    End of Session Equipment Utilized During Treatment: Gait belt Activity Tolerance: Patient tolerated treatment well Patient left: in chair;with call bell/phone within reach;with chair alarm set Nurse Communication: Mobility status PT Visit Diagnosis: Unsteadiness on feet  (R26.81);Muscle weakness (generalized) (M62.81);Difficulty in walking, not elsewhere classified (R26.2)    Time: 8144-8185 PT Time Calculation (min) (ACUTE ONLY): 20 min   Charges:   PT Evaluation $PT Eval Low Complexity: 1 Low          Blake Medina, PT, DPT Acute Rehabilitation Services Pager [phone] Office [phone]   Norval Morton 07/05/2021, 10:52 AM

## 2021-07-05 NOTE — TOC Initial Note (Signed)
Transition of Care [facility]) - Initial/Assessment Note    Patient Details  Name: Blake Medina MRN: [id number] Date of Birth: 10-17-1945  Transition of Care [facility]) CM/SW Contact:    Tom-Johnson, Hershal Coria, RN Phone Number: 07/05/2021, 11:54 AM  Clinical Narrative:                 CM spoke with patient about needs for post hospital transition. Patient states he lives alone and has two adult children whom are supportive. Admitted for Acute renal failure. Patient states he does not drive, he has a cousin who lives close by that transports him to and from his appointments and does his groceries. Patient states he does his own cooking. Has a bedside commode and walker at home but are borrowed. Requesting to get his. Ordered from [employer] and Velna Hatchet to deliver at bedside. Does not have a PCP. Hospital followup appointment scheduled with [facility] per patient's request. Information on AVS. Has [employer] [facility] [address]. PT recommended Home health PT and OT recommends SNF. Will verify recommendations and talk with patient again. CM will continue to follow with needs.   Barriers to Discharge: Continued Medical Work up   Patient Goals and CMS Choice Patient states their goals for this hospitalization and ongoing recovery are:: To go home CMS Medicare.gov Compare Post Acute Care list provided to:: Patient Choice offered to / list presented to : Patient  Expected Discharge Plan and Services     Discharge Planning Services: CM Consult   Living arrangements for the past 2 months: Single Family Home                 DME Arranged: Bedside commode, Walker rolling DME Agency: [employer] Date DME Agency Contacted: 07/05/21 Time DME Agency Contacted: 1152 Representative spoke with at DME Agency: [employer]            Prior Living Arrangements/Services Living arrangements for the past 2 months: Single Family Home Lives with:: Self Patient language and need for interpreter  reviewed:: Yes Do you feel safe going back to the place where you live?: Yes      Need for Family Participation in Patient Care: Yes (Comment) Care giver support system in place?: Yes (comment)   Criminal Activity/Legal Involvement Pertinent to Current Situation/Hospitalization: No - Comment as needed  Activities of Daily Living      Permission Sought/Granted Permission sought to share information with : Case Manager, Family Supports Permission granted to share information with : Yes, Verbal Permission Granted              Emotional Assessment Appearance:: Appears stated age Attitude/Demeanor/Rapport: Engaged, Gracious Affect (typically observed): Accepting, Appropriate, Calm, Hopeful Orientation: : Oriented to Self, Oriented to Place, Oriented to  Time, Oriented to Situation Alcohol / Substance Use: Not Applicable Psych Involvement: No (comment)  Admission diagnosis:  Urinary retention [R33.9] Acute renal failure (ARF) (HCC) [N17.9] Acute cystitis with hematuria [N30.01] AKI (acute kidney injury) (HCC) [N17.9] Acute renal failure, unspecified acute renal failure type (HCC) [N17.9] Patient Active Problem List   Diagnosis Date Noted   AKI (acute kidney injury) (HCC) 07/04/2021   Hypertensive urgency 07/04/2021   Hematuria 07/04/2021   Metabolic acidosis 07/04/2021   Uremia 07/04/2021   BPH (benign prostatic hyperplasia) 07/04/2021   Bilateral hydronephrosis 07/04/2021   Acute renal failure (ARF) (HCC) 07/03/2021   Acute lower UTI 07/03/2021   Obstructive uropathy 07/03/2021   PCP:  Pcp, No [facility]acy[facility]   [id number]  [address], [place] [address] Phone: [phone] Fax: [phone]     Social Determinants of Health (SDOH) Interventions    Readmission Risk Interventions No flowsheet data found.

## 2021-07-05 NOTE — Progress Notes (Addendum)
PROGRESS NOTE    Blake Medina  [id number] DOB: 1946/06/26 DOA: 07/03/2021 PCP: Pcp, No   Brief Narrative: 76 year old with no known medical history, he has not seen a doctor in many years, presents with persistent weakness and fall.  He was also complaining of back pain and hematuria.  He was found to have acute renal failure with a creatinine of 6.5, BUN 87.  CK normal.  Lactic acid mildly elevated 2.3.  CT abdomen pelvis showed numerous bladder calculi, bladder hypodensity suspicious for hemorrhage, distended bladder despite Foley and bilateral hydroureter or nephrosis, prostatic megaly, pulmonary nodule.  He has been getting antibiotics for possible UTI. Urology and nephrology consulted and following.   Assessment & Plan:   Principal Problem:   Acute renal failure (ARF) (HCC) Active Problems:   Acute lower UTI   Obstructive uropathy   Hypertensive urgency   Hematuria   Metabolic acidosis   Uremia   BPH (benign prostatic hyperplasia)   Bilateral hydronephrosis   1-Acute renal failure: Secondary to Obstructive uropathy: Bilateral hydronephrosis, hematuria in setting BPH: -CT abdomen pelvis; Numerous bladder calculi are present. Heterogeneous hyperdensity in the bladder suspicious for hemorrhage measuring 7.0 x 5.6 x 8.1 cm. Bladder neoplasm can not be excluded. Bladder is distended despite Foley catheter placement. There is mild bilateral hydroureteronephrosis. Marked prostatomegaly. -Continue with foley catheter. He will need to be discharge with foley.  -Renal function improved, cr down to 1.2 from 6 on admission.  -needs to follow up with urology for cystoscopy and TURP.  -Started on finasteride.  -Renal US; Mild left renal pelviectasis without overt hydronephrosis. Otherwise unremarkable and normal renal ultrasound.  2-Hypokalemia; replete potassium orally.   UTI: Presents with hematuria, pyuria.  On IV ceftriaxone.  Urine culture send today.   Hypertensive  urgency: Resolved. BP  low now. Not on medications.   Metabolic acidosis: Improved with IV fluids.   Leukocytosis on IV antibiotics.   There are pulmonary nodules in the left lower lobe measuring up to 5 mm on CT scan;  -Needs follow up CT   Estimated body mass index is 23.88 kg/m as calculated from the following:   Height as of this encounter: 6' 2.5" (1.892 m).   Weight as of this encounter: 85.5 kg.   DVT prophylaxis: SCD, no anticoagulation due to hematuria.  Code Status: partial code Family Communication: care discussed with patient.  Disposition Plan:  Status is: Inpatient  Remains inpatient appropriate because: ARF.         Consultants:  Nephrology Urology  Procedures:  Renal US   Antimicrobials:  Ceftriaxone.   Subjective: He denies pain,. No BM since admission. Has poor appetite.   Objective: Vitals:   07/04/21 1530 07/04/21 1615 07/04/21 1855 07/04/21 2022  BP: (!) 157/60 (!) 161/64 (!) 174/73 (!) 142/61  Pulse: 68 79 93 84  Resp: 18 18 20    Temp:   99.9 F (37.7 C) 97.7 F (36.5 C)  TempSrc:   Oral Oral  SpO2: 100% 100% 100% 95%  Weight:   85.5 kg   Height:   6' 2.5" (1.892 m)     Intake/Output Summary (Last 24 hours) at 07/05/2021 0751 Last data filed at 07/05/2021 0649 Gross per 24 hour  Intake 1896.64 ml  Output 700 ml  Net 1196.64 ml   Filed Weights   07/04/21 1855  Weight: 85.5 kg    Examination:  General exam: Appears calm and comfortable  Respiratory system: Clear to auscultation. Respiratory effort normal. Cardiovascular system: S1 &  S2 heard, RRR. No JVD, murmurs, rubs, gallops or clicks. No pedal edema. Gastrointestinal system: Abdomen is nondistended, soft and nontender. No organomegaly or masses felt. Normal bowel sounds heard. Central nervous system: Alert and oriented. No focal neurological deficits. Extremities: Symmetric 5 x 5 power.   Data Reviewed: I have personally reviewed following labs and imaging  studies  CBC: Recent Labs  Lab 07/03/21 0747 07/04/21 0425  WBC 9.6 11.3*  HGB 14.2 11.6*  HCT 40.3 33.2*  MCV 91.0 91.2  PLT 239 [id number]   Basic Metabolic Panel: Recent Labs  Lab 07/03/21 0747 07/04/21 0425  NA 137 141  K 4.0 3.5  CL 101 112*  CO2 19* 19*  GLUCOSE 173* 118*  BUN 87* 50*  CREATININE 6.55* 2.32*  CALCIUM 8.2* 7.3*   GFR: Estimated Creatinine Clearance: 31.5 mL/min (A) (by C-G formula based on SCr of 2.32 mg/dL (H)). Liver Function Tests: Recent Labs  Lab 07/04/21 0425  AST 25  ALT 35  ALKPHOS 66  BILITOT 0.6  PROT 5.3*  ALBUMIN 2.4*   No results for input(s): LIPASE, AMYLASE in the last 168 hours. No results for input(s): AMMONIA in the last 168 hours. Coagulation Profile: No results for input(s): INR, PROTIME in the last 168 hours. Cardiac Enzymes: Recent Labs  Lab 07/03/21 1417  CKTOTAL 180   BNP (last 3 results) No results for input(s): PROBNP in the last 8760 hours. HbA1C: No results for input(s): HGBA1C in the last 72 hours. CBG: No results for input(s): GLUCAP in the last 168 hours. Lipid Profile: No results for input(s): CHOL, HDL, LDLCALC, TRIG, CHOLHDL, LDLDIRECT in the last 72 hours. Thyroid Function Tests: No results for input(s): TSH, T4TOTAL, FREET4, T3FREE, THYROIDAB in the last 72 hours. Anemia Panel: No results for input(s): VITAMINB12, FOLATE, FERRITIN, TIBC, IRON, RETICCTPCT in the last 72 hours. Sepsis Labs: Recent Labs  Lab 07/03/21 1417 07/03/21 1554  LATICACIDVEN 2.3* 1.4    Recent Results (from the past 240 hour(s))  Blood culture (routine x 2)     Status: None (Preliminary result)   Collection Time: 07/03/21  7:21 AM   Specimen: BLOOD  Result Value Ref Range Status   Specimen Description BLOOD SITE NOT SPECIFIED  Final   Special Requests   Final    BOTTLES DRAWN AEROBIC AND ANAEROBIC Blood Culture adequate volume   Culture   Final    NO GROWTH < 24 HOURS Performed at [facility], [place]  [address] [place], [place] [address]    Report Status PENDING  Incomplete  Blood culture (routine x 2)     Status: None (Preliminary result)   Collection Time: 07/03/21  2:17 PM   Specimen: BLOOD  Result Value Ref Range Status   Specimen Description BLOOD RIGHT ANTECUBITAL  Final   Special Requests   Final    BOTTLES DRAWN AEROBIC AND ANAEROBIC Blood Culture adequate volume   Culture   Final    NO GROWTH < 24 HOURS Performed at [facility], [place] [address], [place], [place] [address]    Report Status PENDING  Incomplete  Resp Panel by RT-PCR (Flu A&B, Covid) Nasopharyngeal Swab     Status: None   Collection Time: 07/04/21  2:23 PM   Specimen: Nasopharyngeal Swab; Nasopharyngeal(NP) swabs in vial transport medium  Result Value Ref Range Status   SARS Coronavirus 2 by RT PCR NEGATIVE NEGATIVE Final    Comment: (NOTE) SARS-CoV-2 target nucleic acids are NOT DETECTED.  The SARS-CoV-2  RNA is generally detectable in upper respiratory specimens during the acute phase of infection. The lowest concentration of SARS-CoV-2 viral copies this assay can detect is 138 copies/mL. A negative result does not preclude SARS-Cov-2 infection and should not be used as the sole basis for treatment or other patient management decisions. A negative result may occur with  improper specimen collection/handling, submission of specimen other than nasopharyngeal swab, presence of viral mutation(s) within the areas targeted by this assay, and inadequate number of viral copies(<138 copies/mL). A negative result must be combined with clinical observations, patient history, and epidemiological information. The expected result is Negative.  Fact Sheet for Patients:  EntrepreneurPulse.com.au  Fact Sheet for Healthcare Providers:  IncredibleEmployment.be  This test is no t yet approved or cleared by the [place] FDA and  has been authorized for detection and/or diagnosis  of SARS-CoV-2 by FDA under an Emergency Use Authorization (EUA). This EUA will remain  in effect (meaning this test can be used) for the duration of the COVID-19 declaration under Section 564(b)(1) of the Act, 21 U.S.C.section 360bbb-3(b)(1), unless the authorization is terminated  or revoked sooner.       Influenza A by PCR NEGATIVE NEGATIVE Final   Influenza B by PCR NEGATIVE NEGATIVE Final    Comment: (NOTE) The Xpert Xpress SARS-CoV-2/FLU/RSV plus assay is intended as an aid in the diagnosis of influenza from Nasopharyngeal swab specimens and should not be used as a sole basis for treatment. Nasal washings and aspirates are unacceptable for Xpert Xpress SARS-CoV-2/FLU/RSV testing.  Fact Sheet for Patients: EntrepreneurPulse.com.au  Fact Sheet for Healthcare Providers: IncredibleEmployment.be  This test is not yet approved or cleared by the [place] FDA and has been authorized for detection and/or diagnosis of SARS-CoV-2 by FDA under an Emergency Use Authorization (EUA). This EUA will remain in effect (meaning this test can be used) for the duration of the COVID-19 declaration under Section 564(b)(1) of the Act, 21 U.S.C. section 360bbb-3(b)(1), unless the authorization is terminated or revoked.  Performed at [facility], [place] [address], [place], [place] [address]          Radiology Studies: CT ABDOMEN PELVIS WO CONTRAST  Addendum Date: 07/03/2021   ADDENDUM REPORT: 07/03/2021 17:01 IMPRESSION: 1. Numerous bladder calculi are present. 2. Heterogeneous hyperdensity in the bladder suspicious for hemorrhage measuring 7.0 x 5.6 x 8.1 cm. Bladder neoplasm can not be excluded. 3. Bladder is distended despite Foley catheter placement. There is mild bilateral hydroureteronephrosis. 4. Marked prostatomegaly. 5. There are pulmonary nodules in the left lower lobe measuring up to 5 mm. Consider follow-up dedicated chest CT for further  evaluation. No follow-up needed if patient is low-risk (and has no known or suspected primary neoplasm). Non-contrast chest CT can be considered in 12 months if patient is high-risk. This recommendation follows the consensus statement: Guidelines for Management of Incidental Pulmonary Nodules Detected on CT Images: From the Fleischner Society 2017; Radiology 2017; 284:228-243. Electronically Signed   By: Ronney Asters M.D.   On: 07/03/2021 17:01   Result Date: 07/03/2021 CLINICAL DATA:  Acute abdominal pain.  Fall 1 week ago. EXAM: CT ABDOMEN AND PELVIS WITHOUT CONTRAST TECHNIQUE: Multidetector CT imaging of the abdomen and pelvis was performed following the standard protocol without IV contrast. COMPARISON:  None. FINDINGS: Lower chest: There is a 5 mm left lower lobe pulmonary nodule. Scratch there are 2 pulmonary nodules in the scratch there are 3 pulmonary nodules in the visualized left lower lobe measuring up to  5 mm. There is some tree-in-bud opacities in the left lung base, likely infectious/inflammatory. Hepatobiliary: No focal liver abnormality is seen. No gallstones, gallbladder wall thickening, or biliary dilatation. Pancreas: Unremarkable. No pancreatic ductal dilatation or surrounding inflammatory changes. Spleen: Normal in size without focal abnormality. Adrenals/Urinary Tract: There is mild bilateral hydronephrosis to the level of the bladder. No obstructing calculi are seen. No focal renal lesions are identified. The adrenal glands are within normal limits. Foley catheter is in the bladder. There is heterogeneous mild hyperdensity in the bladder suspicious for bladder mass versus hemorrhage measuring 7.5 by 5.0 by 4.3 cm. There are numerous bladder calculi measuring up to 15 mm. There is no inflammation surrounding the bladder. Small amount of air in the bladder is likely related to Foley catheter placement. Stomach/Bowel: No evidence of bowel wall thickening, distention, or inflammatory changes.  The appendix is not visualized. There is a small hiatal hernia. Stomach is otherwise within normal limits. Vascular/Lymphatic: Aortic atherosclerosis. No enlarged abdominal or pelvic lymph nodes. Reproductive: Prostate gland is enlarged measuring 7.0 x 5.6 by 8.1 cm. Other: No abdominal wall hernia or abnormality. No abdominopelvic ascites. Musculoskeletal: Degenerative changes affect the spine and hips. No acute fractures are seen. IMPRESSION: 1.  Numerous bladder calculi are present. 2. Heterogeneous hyperdensity in the bladder suspicious for hemorrhage measuring 7.0 x 5.6 x 8.1 cm. Bladder neoplasm can not be excluded. 3. Bladder is distended despite Foley catheter placement. There is mild bilateral hydroureteronephrosis. 4.  Marked prostatomegaly. Electronically Signed: By: Ronney Asters M.D. On: 07/03/2021 16:53   US Renal  Result Date: 07/03/2021 CLINICAL DATA:  Initial evaluation for hematuria, urinary retention. EXAM: RENAL / URINARY TRACT ULTRASOUND COMPLETE COMPARISON:  Prior CT from 06/23/2021. FINDINGS: Right Kidney: Renal measurements: 11.9 x 5.2 x 6.9 cm = volume: 224.8 mL. Renal echogenicity within normal limits. No nephrolithiasis or hydronephrosis. No focal renal mass. Left Kidney: Renal measurements: 11.2 x 5.1 x 6.2 cm = volume: 187.0 mL. Renal echogenicity within normal limits. No nephrolithiasis. Mild pelviectasis without overt hydronephrosis. No focal renal mass. Bladder: Not assessed on this examination. Other: None. IMPRESSION: 1. Mild left renal pelviectasis without overt hydronephrosis. 2. Otherwise unremarkable and normal renal ultrasound. Electronically Signed   By: Jeannine Boga M.D.   On: 07/03/2021 19:30        Scheduled Meds:  Chlorhexidine Gluconate Cloth  6 each Topical Daily   finasteride  5 mg Oral Daily   sodium chloride flush  3 mL Intravenous Q12H   Continuous Infusions:  cefTRIAXone (ROCEPHIN)  IV Stopped (07/04/21 1024)   sodium bicarbonate 150 mEq in  D5W infusion 100 mL/hr at 07/05/21 0254     LOS: 1 day    Time spent: 35 minutes.     Elmarie Shiley, MD [facility]   If 7PM-7AM, please contact night-coverage [email or web address]  07/05/2021, 7:51 AM

## 2021-07-06 DIAGNOSIS — N179 Acute kidney failure, unspecified: Secondary | ICD-10-CM | POA: Diagnosis not present

## 2021-07-06 LAB — BASIC METABOLIC PANEL
Anion gap: 8 (ref 5–15)
BUN: 15 mg/dL (ref 8–23)
CO2: 24 mmol/L (ref 22–32)
Calcium: 7.4 mg/dL — ABNORMAL LOW (ref 8.9–10.3)
Chloride: 106 mmol/L (ref 98–111)
Creatinine, Ser: 1.1 mg/dL (ref 0.61–1.24)
GFR, Estimated: >60 mL/min (ref >60)
Glucose, Bld: 113 mg/dL — ABNORMAL HIGH (ref 70–99)
Potassium: 3.7 mmol/L (ref 3.5–5.1)
Sodium: 138 mmol/L (ref 135–145)

## 2021-07-06 LAB — URINE CULTURE: Culture: NO GROWTH

## 2021-07-06 LAB — CBC
HCT: 31.5 % — ABNORMAL LOW (ref 39.0–52.0)
Hemoglobin: 11 g/dL — ABNORMAL LOW (ref 13.0–17.0)
MCH: 31.9 pg (ref 26.0–34.0)
MCHC: 34.9 g/dL (ref 30.0–36.0)
MCV: 91.3 fL (ref 80.0–100.0)
Platelets: 212 10*3/uL (ref 150–400)
RBC: 3.45 MIL/uL — ABNORMAL LOW (ref 4.22–5.81)
RDW: 12.8 % (ref 11.5–15.5)
WBC: 11.6 10*3/uL — ABNORMAL HIGH (ref 4.0–10.5)
nRBC: 0 % (ref 0.0–0.2)

## 2021-07-06 MED ORDER — FINASTERIDE 5 MG PO TABS: 5.0000 mg | ORAL_TABLET | Freq: Every day | ORAL | 3 refills | Status: AC

## 2021-07-06 MED ORDER — CEPHALEXIN 500 MG PO CAPS: 500.0000 mg | ORAL_CAPSULE | Freq: Four times a day (QID) | ORAL | 0 refills | Status: AC

## 2021-07-06 NOTE — TOC Progression Note (Signed)
Transition of Care [facility]) - Initial/Assessment Note    Patient Details  Name: Blake Medina MRN: [id number] Date of Birth: 1946-01-20  Transition of Care [facility]) CM/SW Contact:    Milinda Antis, [place] Phone Number: 07/06/2021, 1:34 PM  Clinical Narrative:                 [facility], patient's choice facility, has extended a bed offer.  Pending: d/c summary and signed FL2  Expected Discharge Plan: Skilled Nursing Facility Barriers to Discharge: SNF Pending bed offer   Patient Goals and CMS Choice Patient states their goals for this hospitalization and ongoing recovery are:: To go home CMS Medicare.gov Compare Post Acute Care list provided to:: Patient Choice offered to / list presented to : Patient  Expected Discharge Plan and Services Expected Discharge Plan: [place]   Discharge Planning Services: CM Consult   Living arrangements for the past 2 months: Single Family Home                 DME Arranged: Bedside commode, Walker rolling DME Agency: [employer] Date DME Agency Contacted: 07/05/21 Time DME Agency Contacted: [id number] Representative spoke with at DME Agency: [place] Arrangements/Services Living arrangements for the past 2 months: [place] with:: Self Patient language and need for interpreter reviewed:: Yes Do you feel safe going back to the place where you live?: Yes      Need for Family Participation in Patient Care: Yes (Comment) Care giver support system in place?: Yes (comment)   Criminal Activity/Legal Involvement Pertinent to Current Situation/Hospitalization: No - Comment as needed  Activities of Daily Living      Permission Sought/Granted Permission sought to share information with : Case Manager, Family Supports Permission granted to share information with : Yes, Verbal Permission Granted              Emotional Assessment Appearance:: Appears stated age Attitude/Demeanor/Rapport:  Engaged, Gracious Affect (typically observed): Accepting, Appropriate, Calm, Hopeful Orientation: : Oriented to Self, Oriented to Place, Oriented to  Time, Oriented to Situation Alcohol / Substance Use: Not Applicable Psych Involvement: No (comment)  Admission diagnosis:  Urinary retention [R33.9] Acute renal failure (ARF) (HCC) [N17.9] Acute cystitis with hematuria [N30.01] AKI (acute kidney injury) ([place]) [N17.9] Acute renal failure, unspecified acute renal failure type ([place]) [N17.9] Patient Active Problem List   Diagnosis Date Noted   AKI (acute kidney injury) ([place]) 07/04/2021   Hypertensive urgency 07/04/2021   Hematuria [id number]   Metabolic acidosis XX123456   Uremia 07/04/2021   BPH (benign prostatic hyperplasia) 07/04/2021   Bilateral hydronephrosis 07/04/2021   Acute renal failure (ARF) ([place]) 07/03/2021   Acute lower UTI 07/03/2021   Obstructive uropathy 07/03/2021   PCP:  Pcp, No Pharmacy:   [id number] [address], [place] [address] Phone: [phone] Fax: [phone]     Social Determinants of Health (SDOH) Interventions    Readmission Risk Interventions No flowsheet data found.

## 2021-07-06 NOTE — NC FL2 (Signed)
[place] MEDICAID FL2 LEVEL OF CARE SCREENING TOOL     IDENTIFICATION  Patient Name: Blake Medina Birthdate: 1945-11-19 Sex: male Admission Date (Current Location): 07/03/2021  [facility] and [place] Number:  Herbalist and Address:  [facility] [place], [place] [address], [place], [place] [address]      Provider Number: [id number]  Attending Physician Name and Address:  Elmarie Shiley, MD  Relative Name and Phone Number:  glass,harmen (Other)   [phone]    Current Level of Care: Hospital Recommended Level of Care: [place] Prior Approval Number:    Date Approved/Denied:   PASRR Number: [id number]  Discharge Plan: SNF    Current Diagnoses: Patient Active Problem List   Diagnosis Date Noted   AKI (acute kidney injury) ([place]) 07/04/2021   Hypertensive urgency 07/04/2021   Hematuria [id number]   Metabolic acidosis XX123456   Uremia 07/04/2021   BPH (benign prostatic hyperplasia) 07/04/2021   Bilateral hydronephrosis 07/04/2021   Acute renal failure (ARF) ([place]) 07/03/2021   Acute lower UTI 07/03/2021   Obstructive uropathy 07/03/2021    Orientation RESPIRATION BLADDER Height & Weight     Self, Time, Place, Situation  Normal Continent Weight: 188 lb 7.9 oz (85.5 kg) Height:  6' 2.5" (189.2 cm)  BEHAVIORAL SYMPTOMS/MOOD NEUROLOGICAL BOWEL NUTRITION STATUS      Continent Diet (see d/c summary)  AMBULATORY STATUS COMMUNICATION OF NEEDS Skin   Limited Assist Verbally Normal                       Personal Care Assistance Level of Assistance  Feeding, Dressing, Bathing Bathing Assistance: Limited assistance Feeding assistance: Independent Dressing Assistance: Limited assistance     Functional Limitations Info  Hearing, Speech, Sight Sight Info: Impaired Hearing Info: Adequate Speech Info: Adequate    SPECIAL CARE FACTORS FREQUENCY  OT (By licensed OT), PT (By licensed PT)     PT Frequency: 5x/ week OT  Frequency: 5x/week            Contractures Contractures Info: Not present    Additional Factors Info  Code Status, Allergies Code Status Info: Partial Allergies Info: NKA           Current Medications (07/06/2021):  This is the current hospital active medication list Current Facility-Administered Medications  Medication Dose Route Frequency Provider Last Rate Last Admin   0.9 %  sodium chloride infusion   Intravenous Continuous Regalado, Belkys A, MD 75 mL/hr at 07/06/21 0327 New Bag at 07/06/21 0327   acetaminophen (TYLENOL) tablet 650 mg  650 mg Oral Q6H PRN Marcelyn Bruins, MD   650 mg at 07/05/21 2007   Or   acetaminophen (TYLENOL) suppository 650 mg  650 mg Rectal Q6H PRN Marcelyn Bruins, MD       cefTRIAXone (ROCEPHIN) 2 g in sodium chloride 0.9 % 100 mL IVPB  2 g Intravenous Q24H Marcelyn Bruins, MD 200 mL/hr at 07/06/21 0941 2 g at 07/06/21 0941   Chlorhexidine Gluconate Cloth 2 % PADS 6 each  6 each Topical Daily Lavina Hamman, MD   6 each at 07/06/21 0939   feeding supplement (ENSURE ENLIVE / ENSURE PLUS) liquid 237 mL  237 mL Oral BID BM Regalado, Belkys A, MD   237 mL at 07/06/21 0939   finasteride (PROSCAR) tablet 5 mg  5 mg Oral Daily Marton Redwood III, MD   5 mg at 07/06/21 0939   melatonin tablet  3 mg  3 mg Oral QHS PRN Regalado, Belkys A, MD   3 mg at 07/05/21 2008   polyethylene glycol (MIRALAX / GLYCOLAX) packet 17 g  17 g Oral Daily Regalado, Belkys A, MD       sodium chloride flush (NS) 0.9 % injection 3 mL  3 mL Intravenous Q12H Marcelyn Bruins, MD   3 mL at 07/04/21 [id number]     Discharge Medications: Please see discharge summary for a list of discharge medications.  Relevant Imaging Results:  Relevant Lab Results:   Additional Information SSN:  [id number]; 6'2" 188lbs; No COVID vaccines  Rama Mcclintock F Dominique Ressel, LCSWA

## 2021-07-06 NOTE — Discharge Summary (Addendum)
Physician Discharge Summary  Blake Medina [id number] DOB: 02-08-1946 DOA: 07/03/2021  PCP: Pcp, No  Admit date: 07/03/2021 Discharge date: 07/06/2021  Admitted From: Home  Disposition: SNF  Recommendations for Outpatient Follow-up:  Follow up with PCP in 1-2 weeks Please obtain BMP/CBC in one week Needs to follow up with Urology Dr Gloriann Loan in 1 week Needs follow up CT for lung nodule.     Discharge Condition: Stable.  CODE STATUS:full code Diet recommendation: Heart Healthy   Brief/Interim Summary: 76 year old with no known medical history, he has not seen a doctor in many years, presents with persistent weakness and fall.  He was also complaining of back pain and hematuria.  He was found to have acute renal failure with a creatinine of 6.5, BUN 87.  CK normal.  Lactic acid mildly elevated 2.3.  CT abdomen pelvis showed numerous bladder calculi, bladder hypodensity suspicious for hemorrhage, distended bladder despite Foley and bilateral hydroureter or nephrosis, prostatic megaly, pulmonary nodule.  He has been getting antibiotics for possible UTI. Urology and nephrology consulted and following.    1-Acute renal failure: Secondary to Obstructive uropathy: Bilateral hydronephrosis, hematuria in setting BPH: -CT abdomen pelvis; Numerous bladder calculi are present. Heterogeneous hyperdensity in the bladder suspicious for hemorrhage measuring 7.0 x 5.6 x 8.1 cm. Bladder neoplasm can not be excluded. Bladder is distended despite Foley catheter placement. There is mild bilateral hydroureteronephrosis. Marked prostatomegaly. -Continue with foley catheter. He will need to be discharge with foley.  -Renal function improved, cr down to 1.2 from 6 on admission.  -needs to follow up with urology for cystoscopy and TURP.  -Started on finasteride.  -Renal US; Mild left renal pelviectasis without overt hydronephrosis. Otherwise unremarkable and normal renal ultrasound.  renal failure resolved.    2-Hypokalemia; replete potassium orally.    UTI: Presents with hematuria, pyuria.  On IV ceftriaxone.  Discharge on Keflex for 5 days.    Hypertensive urgency: Resolved. BP  low now. Not on medications.    Metabolic acidosis: Improved with IV fluids.    Leukocytosis on IV antibiotics.    There are pulmonary nodules in the left lower lobe measuring up to 5 mm on CT scan;  -Needs follow up CT    Estimated body mass index is 23.88 kg/m as calculated from the following:   Height as of this encounter: 6' 2.5" (1.892 m).   Weight as of this encounter: 85.5 kg.      Discharge Diagnoses:  Principal Problem:   Acute renal failure (ARF) (HCC) Active Problems:   Acute lower UTI   Obstructive uropathy   Hypertensive urgency   Hematuria   Metabolic acidosis   Uremia   BPH (benign prostatic hyperplasia)   Bilateral hydronephrosis    Discharge Instructions  Discharge Instructions     Diet - low sodium heart healthy   Complete by: As directed    Increase activity slowly   Complete by: As directed       Allergies as of 07/06/2021   Not on File      Medication List     STOP taking these medications    aspirin 500 MG tablet   OVER THE COUNTER MEDICATION       TAKE these medications    cephALEXin 500 MG capsule Commonly known as: KEFLEX Take 1 capsule (500 mg total) by mouth 4 (four) times daily for 5 days.   finasteride 5 MG tablet Commonly known as: PROSCAR Take 1 tablet (5 mg total) by mouth daily.  Start taking on: July 07, 2021               Durable Medical Equipment  (From admission, onward)           Start     Ordered   07/05/21 1150  For home use only DME 3 n 1  Once        07/05/21 1149   07/05/21 1149  For home use only DME Walker rolling  Once       Question Answer Comment  Walker: With 5 Inch Wheels   Patient needs a walker to treat with the following condition Gait instability      07/05/21 1149             Follow-up Information     [facility]   Specialty: Emergency Medicine Contact information: [address] [id number] [place] [phone]        Marton Redwood III, MD Follow up in 1 week(s).   Specialty: Urology Contact information: [address] [place] [address] [phone]                Not on File  Consultations: Urology  Nephrology    Procedures/Studies: CT ABDOMEN PELVIS WO CONTRAST  Addendum Date: 07/03/2021   ADDENDUM REPORT: 07/03/2021 17:01 IMPRESSION: 1. Numerous bladder calculi are present. 2. Heterogeneous hyperdensity in the bladder suspicious for hemorrhage measuring 7.0 x 5.6 x 8.1 cm. Bladder neoplasm can not be excluded. 3. Bladder is distended despite Foley catheter placement. There is mild bilateral hydroureteronephrosis. 4. Marked prostatomegaly. 5. There are pulmonary nodules in the left lower lobe measuring up to 5 mm. Consider follow-up dedicated chest CT for further evaluation. No follow-up needed if patient is low-risk (and has no known or suspected primary neoplasm). Non-contrast chest CT can be considered in 12 months if patient is high-risk. This recommendation follows the consensus statement: Guidelines for Management of Incidental Pulmonary Nodules Detected on CT Images: From the Fleischner Society 2017; Radiology 2017; 284:228-243. Electronically Signed   By: Ronney Asters M.D.   On: 07/03/2021 17:01   Result Date: 07/03/2021 CLINICAL DATA:  Acute abdominal pain.  Fall 1 week ago. EXAM: CT ABDOMEN AND PELVIS WITHOUT CONTRAST TECHNIQUE: Multidetector CT imaging of the abdomen and pelvis was performed following the standard protocol without IV contrast. COMPARISON:  None. FINDINGS: Lower chest: There is a 5 mm left lower lobe pulmonary nodule. Scratch there are 2 pulmonary nodules in the scratch there are 3 pulmonary nodules in the visualized left lower lobe  measuring up to 5 mm. There is some tree-in-bud opacities in the left lung base, likely infectious/inflammatory. Hepatobiliary: No focal liver abnormality is seen. No gallstones, gallbladder wall thickening, or biliary dilatation. Pancreas: Unremarkable. No pancreatic ductal dilatation or surrounding inflammatory changes. Spleen: Normal in size without focal abnormality. Adrenals/Urinary Tract: There is mild bilateral hydronephrosis to the level of the bladder. No obstructing calculi are seen. No focal renal lesions are identified. The adrenal glands are within normal limits. Foley catheter is in the bladder. There is heterogeneous mild hyperdensity in the bladder suspicious for bladder mass versus hemorrhage measuring 7.5 by 5.0 by 4.3 cm. There are numerous bladder calculi measuring up to 15 mm. There is no inflammation surrounding the bladder. Small amount of air in the bladder is likely related to Foley catheter placement. Stomach/Bowel: No evidence of bowel wall thickening, distention, or inflammatory changes. The appendix is not visualized. There  is a small hiatal hernia. Stomach is otherwise within normal limits. Vascular/Lymphatic: Aortic atherosclerosis. No enlarged abdominal or pelvic lymph nodes. Reproductive: Prostate gland is enlarged measuring 7.0 x 5.6 by 8.1 cm. Other: No abdominal wall hernia or abnormality. No abdominopelvic ascites. Musculoskeletal: Degenerative changes affect the spine and hips. No acute fractures are seen. IMPRESSION: 1.  Numerous bladder calculi are present. 2. Heterogeneous hyperdensity in the bladder suspicious for hemorrhage measuring 7.0 x 5.6 x 8.1 cm. Bladder neoplasm can not be excluded. 3. Bladder is distended despite Foley catheter placement. There is mild bilateral hydroureteronephrosis. 4.  Marked prostatomegaly. Electronically Signed: By: Ronney Asters M.D. On: 07/03/2021 16:53   US Renal  Result Date: 07/03/2021 CLINICAL DATA:  Initial evaluation for  hematuria, urinary retention. EXAM: RENAL / URINARY TRACT ULTRASOUND COMPLETE COMPARISON:  Prior CT from 06/23/2021. FINDINGS: Right Kidney: Renal measurements: 11.9 x 5.2 x 6.9 cm = volume: 224.8 mL. Renal echogenicity within normal limits. No nephrolithiasis or hydronephrosis. No focal renal mass. Left Kidney: Renal measurements: 11.2 x 5.1 x 6.2 cm = volume: 187.0 mL. Renal echogenicity within normal limits. No nephrolithiasis. Mild pelviectasis without overt hydronephrosis. No focal renal mass. Bladder: Not assessed on this examination. Other: None. IMPRESSION: 1. Mild left renal pelviectasis without overt hydronephrosis. 2. Otherwise unremarkable and normal renal ultrasound. Electronically Signed   By: Jeannine Boga M.D.   On: 07/03/2021 19:30     Subjective: He is alert, denies pain   Discharge Exam: Vitals:   07/06/21 0507 07/06/21 0937  BP: (!) 165/63 134/65  Pulse: 73 94  Resp: 18 17  Temp: 98.6 F (37 C) 98.2 F (36.8 C)  SpO2: 100% 98%     General: Pt is alert, awake, not in acute distress Cardiovascular: RRR, S1/S2 +, no rubs, no gallops Respiratory: CTA bilaterally, no wheezing, no rhonchi Abdominal: Soft, NT, ND, bowel sounds + Extremities: no edema, no cyanosis    The results of significant diagnostics from this hospitalization (including imaging, microbiology, ancillary and laboratory) are listed below for reference.     Microbiology: Recent Results (from the past 240 hour(s))  Blood culture (routine x 2)     Status: None (Preliminary result)   Collection Time: 07/03/21  7:21 AM   Specimen: BLOOD  Result Value Ref Range Status   Specimen Description BLOOD SITE NOT SPECIFIED  Final   Special Requests   Final    BOTTLES DRAWN AEROBIC AND ANAEROBIC Blood Culture adequate volume   Culture   Final    NO GROWTH 3 DAYS Performed at [facility], [address] [place], [place] [address]    Report Status PENDING  Incomplete  Blood culture (routine x  2)     Status: None (Preliminary result)   Collection Time: 07/03/21  2:17 PM   Specimen: BLOOD  Result Value Ref Range Status   Specimen Description BLOOD RIGHT ANTECUBITAL  Final   Special Requests   Final    BOTTLES DRAWN AEROBIC AND ANAEROBIC Blood Culture adequate volume   Culture   Final    NO GROWTH 3 DAYS Performed at [facility], [place] [address] [place], [place] [address]    Report Status PENDING  Incomplete  Resp Panel by RT-PCR (Flu A&B, Covid) Nasopharyngeal Swab     Status: None   Collection Time: 07/04/21  2:23 PM   Specimen: Nasopharyngeal Swab; Nasopharyngeal(NP) swabs in vial transport medium  Result Value Ref Range Status   SARS Coronavirus 2 by RT  PCR NEGATIVE NEGATIVE Final    Comment: (NOTE) SARS-CoV-2 target nucleic acids are NOT DETECTED.  The SARS-CoV-2 RNA is generally detectable in upper respiratory specimens during the acute phase of infection. The lowest concentration of SARS-CoV-2 viral copies this assay can detect is 138 copies/mL. A negative result does not preclude SARS-Cov-2 infection and should not be used as the sole basis for treatment or other patient management decisions. A negative result may occur with  improper specimen collection/handling, submission of specimen other than nasopharyngeal swab, presence of viral mutation(s) within the areas targeted by this assay, and inadequate number of viral copies(<138 copies/mL). A negative result must be combined with clinical observations, patient history, and epidemiological information. The expected result is Negative.  Fact Sheet for Patients:  BloggerCourse.com  Fact Sheet for Healthcare Providers:  SeriousBroker.it  This test is no t yet approved or cleared by the [place] FDA and  has been authorized for detection and/or diagnosis of SARS-CoV-2 by FDA under an Emergency Use Authorization (EUA). This EUA will remain  in effect  (meaning this test can be used) for the duration of the COVID-19 declaration under Section 564(b)(1) of the Act, 21 U.S.C.section 360bbb-3(b)(1), unless the authorization is terminated  or revoked sooner.       Influenza A by PCR NEGATIVE NEGATIVE Final   Influenza B by PCR NEGATIVE NEGATIVE Final    Comment: (NOTE) The Xpert Xpress SARS-CoV-2/FLU/RSV plus assay is intended as an aid in the diagnosis of influenza from Nasopharyngeal swab specimens and should not be used as a sole basis for treatment. Nasal washings and aspirates are unacceptable for Xpert Xpress SARS-CoV-2/FLU/RSV testing.  Fact Sheet for Patients: BloggerCourse.com  Fact Sheet for Healthcare Providers: SeriousBroker.it  This test is not yet approved or cleared by the [place] FDA and has been authorized for detection and/or diagnosis of SARS-CoV-2 by FDA under an Emergency Use Authorization (EUA). This EUA will remain in effect (meaning this test can be used) for the duration of the COVID-19 declaration under Section 564(b)(1) of the Act, 21 U.S.C. section 360bbb-3(b)(1), unless the authorization is terminated or revoked.  Performed at [facility], [address] [place], [place] [address]   Urine Culture     Status: None   Collection Time: 07/05/21  7:48 AM   Specimen: Urine, Clean Catch  Result Value Ref Range Status   Specimen Description URINE, CLEAN CATCH  Final   Special Requests NONE  Final   Culture   Final    NO GROWTH Performed at [facility], [address], [place], [place] [address]    Report Status 07/06/2021 FINAL  Final     Labs: BNP (last 3 results) No results for input(s): BNP in the last 8760 hours. Basic Metabolic Panel: Recent Labs  Lab 07/03/21 0747 07/04/21 0425 07/05/21 0727 07/06/21 0229  NA 137 141 138 138  K 4.0 3.5 3.0* 3.7  CL 101 112* 102 106  CO2 19* 19* 27 24  GLUCOSE 173* 118* 126* 113*   BUN 87* 50* 18 15  CREATININE 6.55* 2.32* 1.12 1.10  CALCIUM 8.2* 7.3* 7.2* 7.4*   Liver Function Tests: Recent Labs  Lab 07/04/21 0425  AST 25  ALT 35  ALKPHOS 66  BILITOT 0.6  PROT 5.3*  ALBUMIN 2.4*   No results for input(s): LIPASE, AMYLASE in the last 168 hours. No results for input(s): AMMONIA in the last 168 hours. CBC: Recent Labs  Lab 07/03/21 0747 07/04/21 0425  07/05/21 0727 07/06/21 0229  WBC 9.6 11.3* 13.1* 11.6*  HGB 14.2 11.6* 10.7* 11.0*  HCT 40.3 33.2* 31.0* 31.5*  MCV 91.0 91.2 89.6 91.3  PLT 239 196 198 212   Cardiac Enzymes: Recent Labs  Lab 07/03/21 1417  CKTOTAL 180   BNP: Invalid input(s): POCBNP CBG: No results for input(s): GLUCAP in the last 168 hours. D-Dimer No results for input(s): DDIMER in the last 72 hours. Hgb A1c No results for input(s): HGBA1C in the last 72 hours. Lipid Profile No results for input(s): CHOL, HDL, LDLCALC, TRIG, CHOLHDL, LDLDIRECT in the last 72 hours. Thyroid function studies No results for input(s): TSH, T4TOTAL, T3FREE, THYROIDAB in the last 72 hours.  Invalid input(s): FREET3 Anemia work up No results for input(s): VITAMINB12, FOLATE, FERRITIN, TIBC, IRON, RETICCTPCT in the last 72 hours. Urinalysis    Component Value Date/Time   COLORURINE YELLOW 07/04/2021 0840   APPEARANCEUR CLOUDY (A) 07/04/2021 0840   LABSPEC 1.013 07/04/2021 0840   PHURINE 5.0 07/04/2021 0840   GLUCOSEU NEGATIVE 07/04/2021 0840   HGBUR LARGE (A) 07/04/2021 0840   BILIRUBINUR NEGATIVE 07/04/2021 0840   KETONESUR NEGATIVE 07/04/2021 0840   PROTEINUR 30 (A) 07/04/2021 0840   NITRITE NEGATIVE 07/04/2021 0840   LEUKOCYTESUR LARGE (A) 07/04/2021 0840   Sepsis Labs Invalid input(s): PROCALCITONIN,  WBC,  LACTICIDVEN Microbiology Recent Results (from the past 240 hour(s))  Blood culture (routine x 2)     Status: None (Preliminary result)   Collection Time: 07/03/21  7:21 AM   Specimen: BLOOD  Result Value Ref Range Status    Specimen Description BLOOD SITE NOT SPECIFIED  Final   Special Requests   Final    BOTTLES DRAWN AEROBIC AND ANAEROBIC Blood Culture adequate volume   Culture   Final    NO GROWTH 3 DAYS Performed at [facility], [address], [place], [place] [address]    Report Status PENDING  Incomplete  Blood culture (routine x 2)     Status: None (Preliminary result)   Collection Time: 07/03/21  2:17 PM   Specimen: BLOOD  Result Value Ref Range Status   Specimen Description BLOOD RIGHT ANTECUBITAL  Final   Special Requests   Final    BOTTLES DRAWN AEROBIC AND ANAEROBIC Blood Culture adequate volume   Culture   Final    NO GROWTH 3 DAYS Performed at [facility], [place] [address], [place], [place] [address]    Report Status PENDING  Incomplete  Resp Panel by RT-PCR (Flu A&B, Covid) Nasopharyngeal Swab     Status: None   Collection Time: 07/04/21  2:23 PM   Specimen: Nasopharyngeal Swab; Nasopharyngeal(NP) swabs in vial transport medium  Result Value Ref Range Status   SARS Coronavirus 2 by RT PCR NEGATIVE NEGATIVE Final    Comment: (NOTE) SARS-CoV-2 target nucleic acids are NOT DETECTED.  The SARS-CoV-2 RNA is generally detectable in upper respiratory specimens during the acute phase of infection. The lowest concentration of SARS-CoV-2 viral copies this assay can detect is 138 copies/mL. A negative result does not preclude SARS-Cov-2 infection and should not be used as the sole basis for treatment or other patient management decisions. A negative result may occur with  improper specimen collection/handling, submission of specimen other than nasopharyngeal swab, presence of viral mutation(s) within the areas targeted by this assay, and inadequate number of viral copies(<138 copies/mL). A negative result must be combined with clinical observations, patient history, and epidemiological information. The expected result is  Negative.  Fact Sheet for Patients:   EntrepreneurPulse.com.au  Fact Sheet for Healthcare Providers:  IncredibleEmployment.be  This test is no t yet approved or cleared by the [place] FDA and  has been authorized for detection and/or diagnosis of SARS-CoV-2 by FDA under an Emergency Use Authorization (EUA). This EUA will remain  in effect (meaning this test can be used) for the duration of the COVID-19 declaration under Section 564(b)(1) of the Act, 21 U.S.C.section 360bbb-3(b)(1), unless the authorization is terminated  or revoked sooner.       Influenza A by PCR NEGATIVE NEGATIVE Final   Influenza B by PCR NEGATIVE NEGATIVE Final    Comment: (NOTE) The Xpert Xpress SARS-CoV-2/FLU/RSV plus assay is intended as an aid in the diagnosis of influenza from Nasopharyngeal swab specimens and should not be used as a sole basis for treatment. Nasal washings and aspirates are unacceptable for Xpert Xpress SARS-CoV-2/FLU/RSV testing.  Fact Sheet for Patients: EntrepreneurPulse.com.au  Fact Sheet for Healthcare Providers: IncredibleEmployment.be  This test is not yet approved or cleared by the [place] FDA and has been authorized for detection and/or diagnosis of SARS-CoV-2 by FDA under an Emergency Use Authorization (EUA). This EUA will remain in effect (meaning this test can be used) for the duration of the COVID-19 declaration under Section 564(b)(1) of the Act, 21 U.S.C. section 360bbb-3(b)(1), unless the authorization is terminated or revoked.  Performed at [facility], [place] [address] [place], [place] [address]   Urine Culture     Status: None   Collection Time: 07/05/21  7:48 AM   Specimen: Urine, Clean Catch  Result Value Ref Range Status   Specimen Description URINE, CLEAN CATCH  Final   Special Requests NONE  Final   Culture   Final    NO GROWTH Performed at [facility], [place] [address], [place], [place]  [address]    Report Status 07/06/2021 FINAL  Final     Time coordinating discharge: 40 minutes  SIGNED:   Elmarie Shiley, MD  [facility]

## 2021-07-06 NOTE — Progress Notes (Addendum)
Physical Therapy Treatment Patient Details Name: Blake Medina MRN: [id number] DOB: 1946/03/29 Today's Date: 07/06/2021   History of Present Illness Pt is a 76 y.o. M who presents 07/03/2021 after a fall and generalized weakness. Reports peeing blood for last week. Found to have ARF, bilateral hydronephrosis, obstructive uropathy, acute lower UTI, HTN urgency, metabolic acidosis. Pt was hydrated aggressively and Foley catheter was placed. Creatinine improved significantly and nephrology signed off. No pertinent PMH.    PT Comments    Pt is progressing steadily towards his physical therapy goals. Ambulating limited hallway distances with a walker at a supervision level. HR 93-111 bpm. Once returned to room, worked on seated exercises for BLE strengthening and serial sit to stands with varying hand support. Pt continues with decreased activity tolerance, weakness, decreased flexibility, and balance deficits. He has difficulty performing basic ADL's such as peri care after toileting and requires assist. Pt presents as a high fall risk based on decreased gait speed and history of falls. In light of decreased caregiver support and deficits listed above, recommending SNF.     Recommendations for follow up therapy are one component of a multi-disciplinary discharge planning process, led by the attending physician.  Recommendations may be updated based on patient status, additional functional criteria and insurance authorization.  Follow Up Recommendations  Skilled nursing-short term rehab (<3 hours/day)     Assistance Recommended at Discharge PRN  Patient can return home with the following Assistance with cooking/housework   Equipment Recommendations  Rolling walker (2 wheels)    Recommendations for Other Services       Precautions / Restrictions Precautions Precautions: Fall;Other (comment) Precaution Comments: L eye blind Restrictions Weight Bearing Restrictions: No     Mobility  Bed  Mobility Overal bed mobility: Needs Assistance Bed Mobility: Supine to Sit     Supine to sit: Supervision     General bed mobility comments: HOB flat, use of rail, no physical assist required    Transfers Overall transfer level: Needs assistance Equipment used: None;Rolling walker (2 wheels) Transfers: Sit to/from Stand;Bed to chair/wheelchair/BSC Sit to Stand: Min guard;Supervision Stand pivot transfers: Min guard         General transfer comment: Min guard for safety with transition from bed to bedside commode. Progressing to supervision for subsequent transfers    Ambulation/Gait Ambulation/Gait assistance: Supervision Gait Distance (Feet): 160 Feet Assistive device: Rolling walker (2 wheels) Gait Pattern/deviations: Step-through pattern;Decreased stride length;Trunk flexed;Decreased dorsiflexion - right;Decreased dorsiflexion - left Gait velocity: decreased     General Gait Details: Cues for upward gaze, upright posture. Pt with decreased bilateral foot clearance/heel strike, likely due to tight gastrocs. No gross imbalance with use of walker   Stairs             Wheelchair Mobility    Modified Rankin (Stroke Patients Only)       Balance Overall balance assessment: Needs assistance Sitting-balance support: Feet supported Sitting balance-Leahy Scale: Good     Standing balance support: Reliant on assistive device for balance;Single extremity supported Standing balance-Leahy Scale: Poor Standing balance comment: reliant on at least single UE support                            Cognition Arousal/Alertness: Awake/alert Behavior During Therapy: WFL for tasks assessed/performed Overall Cognitive Status: Within Functional Limits for tasks assessed  Exercises General Exercises - Lower Extremity Long Arc Quad: Both;10 reps;Seated Hip Flexion/Marching: Both;10 reps;Seated Toe Raises:  Both;Seated;20 reps Other Exercises Other Exercises: x3 Serial sit to stands with unilateral hand support vs bilateral    General Comments        Pertinent Vitals/Pain Pain Assessment: Faces Faces Pain Scale: Hurts a little bit Pain Location: L hip, back Pain Descriptors / Indicators: Sore Pain Intervention(s): Monitored during session    Home Living                          Prior Function            PT Goals (current goals can now be found in the care plan section) Acute Rehab PT Goals Patient Stated Goal: be more active PT Goal Formulation: With patient Time For Goal Achievement: 07/19/21 Potential to Achieve Goals: Good Progress towards PT goals: Progressing toward goals    Frequency    Min 3X/week      PT Plan Discharge plan needs to be updated    Co-evaluation              AM-PAC PT "6 Clicks" Mobility   Outcome Measure  Help needed turning from your back to your side while in a flat bed without using bedrails?: None Help needed moving from lying on your back to sitting on the side of a flat bed without using bedrails?: A Little Help needed moving to and from a bed to a chair (including a wheelchair)?: A Little Help needed standing up from a chair using your arms (e.g., wheelchair or bedside chair)?: A Little Help needed to walk in hospital room?: A Little Help needed climbing 3-5 steps with a railing? : A Little 6 Click Score: 19    End of Session Equipment Utilized During Treatment: Gait belt Activity Tolerance: Patient tolerated treatment well Patient left: in chair;with call bell/phone within reach;with chair alarm set Nurse Communication: Mobility status PT Visit Diagnosis: Unsteadiness on feet (R26.81);Muscle weakness (generalized) (M62.81);Difficulty in walking, not elsewhere classified (R26.2)     Time: 5397-6734 PT Time Calculation (min) (ACUTE ONLY): 46 min  Charges:  $Gait Training: 8-22 mins $Therapeutic Activity:  23-37 mins                     Lillia Pauls, PT, DPT Acute Rehabilitation Services Pager [phone] Office [phone]    Norval Morton 07/06/2021, 10:28 AM

## 2021-07-06 NOTE — TOC Transition Note (Signed)
Transition of Care [facility]) - CM/SW Discharge Note   Patient Details  Name: [name] MRN: [id number] Date of Birth: [birth year]  Transition [facility]) CM/SW Contact:  Milinda Antis, [place] Phone Number: 07/06/2021, 3:14 PM   Clinical Narrative:    Patient will DC to: SNF Anticipated DC date:  07/06/2021 Transport by: Corey Harold   Per MD patient ready for DC to SNF. RN to call report prior to discharge ([phone]. RN, patient,  and facility notified of DC. Discharge Summary and FL2 sent to facility. DC packet on chart. Ambulance transport requested for patient.   CSW will sign off for now as social work intervention is no longer needed. Please consult [place] again if new needs arise.     Final next level of care: Skilled Nursing Facility Barriers to Discharge: Barriers Resolved   Patient Goals and CMS Choice Patient states their goals for this hospitalization and ongoing recovery are:: To go home CMS Medicare.gov Compare Post Acute Care list provided to:: Patient Choice offered to / list presented to : Patient  Discharge Placement              Patient chooses bed at:  ([facility]) Patient to be transferred to facility by: [place] Name of family member notified: patient alert and oriented Patient and family notified of of transfer: 07/06/21  Discharge Plan and Services   Discharge Planning Services: CM Consult            DME Arranged: Bedside commode, Walker rolling DME Agency: [employer] Date DME Agency Contacted: 07/05/21 Time DME Agency Contacted: [phone] Representative spoke with at DME Agency: [place] ([place]) Interventions     Readmission Risk Interventions No flowsheet data found.

## 2021-07-06 NOTE — TOC Initial Note (Signed)
Transition of Care [facility]) - Initial/Assessment Note    Patient Details  Name: Blake Medina MRN: [id number] Date of Birth: [birth year]  Transition of Care [facility]) CM/SW Contact:    Milinda Antis, [place] Phone Number: 07/06/2021, 11:28 AM  Clinical Narrative:                 CSW received consult for possible SNF placement at time of discharge. CSW spoke with patient. Patient reported that he is from home alone and will need to be able to mobilize more prior to returning home.  Patient expressed understanding of PT recommendation and is agreeable to SNF placement at time of discharge. Patient reports preference for [facility]. CSW discussed insurance authorization process and will provide Medicare SNF ratings list. Patient has received 0 COVID vaccines. CSW will send out referrals for review. Patient expressed being hopeful for rehab and to feel better soon. No further questions reported at this time.   Skilled Nursing Rehab Facilities-   [email or web address]   Ratings out of 5 possible   Name Address  Phone # [place] Overall  [facility] [address], [place] 5 5 2 4   [facility]  [address] [employer], [place] [phone] 4 2 5 5   [facility] [place], [place] 4 1 1 1   [place] 2 2 4 4   [facility] [address], [place] 2 1 1 1   [place] 3 1 4 3   [facility] [address] [place] 5 2 2 3   [facility] [address], [place] 4 1 2 1   [place] [facility] [place] 5 1 2 2   [facility] [phone], [place] [phone] 4 1 1 1   [facility] [address] [facility] [phone] 4 1 2 1   [place].  [address] 4 1 1 1       Expected Discharge Plan: Skilled Nursing Facility Barriers to Discharge: SNF Pending bed offer   Patient Goals and CMS Choice Patient states their goals for this hospitalization and ongoing recovery are:: To go home CMS Medicare.gov Compare Post Acute Care list provided to:: Patient Choice offered to / list presented to : Patient  Expected Discharge Plan and Services Expected Discharge Plan: [place]   Discharge Planning Services: CM Consult   Living arrangements for the past 2 months: Single Family Home                 DME Arranged: Bedside commode, Walker rolling DME Agency: [employer] Date DME Agency Contacted: 07/05/21 Time DME Agency Contacted: 14/02/23 Representative spoke with at DME Agency: [place] Arrangements/Services Living arrangements for the past 2 months: [place] with:: Self Patient language and need for interpreter reviewed:: Yes Do you feel safe going back to the place where you live?: Yes      Need for Family Participation in Patient Care: Yes (Comment) Care giver support system in place?: Yes (comment)   Criminal Activity/Legal Involvement Pertinent to Current Situation/Hospitalization: No - Comment as needed  Activities of Daily Living      Permission Sought/Granted Permission sought to share information with : Case Manager, Family Supports Permission granted to share information with : Yes, Verbal Permission  Granted              Emotional Assessment Appearance:: Appears stated age Attitude/Demeanor/Rapport: Engaged, Gracious Affect (typically observed): Accepting, Appropriate, Calm, Hopeful Orientation: : Oriented to Self, Oriented to Place, Oriented to  Time, Oriented to Situation Alcohol / Substance Use: Not Applicable Psych Involvement: No (comment)  Admission diagnosis:  Urinary retention [R33.9] Acute renal failure (ARF) (HCC)  [N17.9] Acute cystitis with hematuria [N30.01] AKI (acute kidney injury) ([place]) [N17.9] Acute renal failure, unspecified acute renal failure type ([place]) [N17.9] Patient Active Problem List   Diagnosis Date Noted   AKI (acute kidney injury) ([place]) 07/04/2021   Hypertensive urgency 07/04/2021   Hematuria [id number]   Metabolic acidosis [id number]   Uremia 07/04/2021   BPH (benign prostatic hyperplasia) 07/04/2021   Bilateral hydronephrosis 07/04/2021   Acute renal failure (ARF) ([place]) 07/03/2021   Acute lower UTI 07/03/2021   Obstructive uropathy 07/03/2021   PCP:  Pcp, No Pharmacy:   [id number] [address], [place] [address] Phone: [phone] Fax: [phone]     Social Determinants of Health (SDOH) Interventions    Readmission Risk Interventions No flowsheet data found.

## 2021-07-06 NOTE — Progress Notes (Signed)
Occupational Therapy Treatment Patient Details Name: Blake Medina MRN: [id number] DOB: 09-Sep-1945 Today's Date: 07/06/2021   History of present illness Pt is a 76 y.o. M who presents 07/03/2021 after a fall and generalized weakness. Reports peeing blood for last week. Found to have ARF, bilateral hydronephrosis, obstructive uropathy, acute lower UTI, HTN urgency, metabolic acidosis. Pt was hydrated aggressively and Foley catheter was placed. Creatinine improved significantly and nephrology signed off. No pertinent PMH.   OT comments  Patient received in recliner and agreeable to OT treatment. Patient instructed on reacher use for doffing socks and sock aide for donning socks with patient requiring frequent cues and mod assist. Patient asked to use BSC but had BM in chair and required mod assist to clean up with patient standing with RW.  Discussed discharge plans with patient and he states he doesn't believe he would be safe at home alone. Acute OT to continue to follow.    Recommendations for follow up therapy are one component of a multi-disciplinary discharge planning process, led by the attending physician.  Recommendations may be updated based on patient status, additional functional criteria and insurance authorization.    Follow Up Recommendations  Skilled nursing-short term rehab (<3 hours/day)    Assistance Recommended at Discharge Intermittent Supervision/Assistance  Patient can return home with the following  A little help with walking and/or transfers;A little help with bathing/dressing/bathroom;Assistance with cooking/housework;Direct supervision/assist for medications management;Assist for transportation   Equipment Recommendations  BSC/3in1;Other (comment)    Recommendations for Other Services      Precautions / Restrictions Precautions Precautions: Fall;Other (comment) Precaution Comments: L eye blind Restrictions Weight Bearing Restrictions: No       Mobility Bed  Mobility Overal bed mobility: Needs Assistance Bed Mobility: Sit to Supine     Supine to sit: Supervision Sit to supine: Min assist   General bed mobility comments: rquired assistance with BLE to get into bed    Transfers Overall transfer level: Needs assistance Equipment used: Rolling walker (2 wheels) Transfers: Sit to/from Stand;Bed to chair/wheelchair/BSC Sit to Stand: Min guard;Supervision Stand pivot transfers: Min guard Step pivot transfers: Min guard       General transfer comment: performed transfer from recliner to EOB with RW     Balance Overall balance assessment: Needs assistance Sitting-balance support: Feet supported Sitting balance-Leahy Scale: Good     Standing balance support: Reliant on assistive device for balance;Single extremity supported Standing balance-Leahy Scale: Poor Standing balance comment: reliant on at least single UE support                           ADL either performed or assessed with clinical judgement   ADL Overall ADL's : Needs assistance/impaired                 Upper Body Dressing : Set up;Sitting Upper Body Dressing Details (indicate cue type and reason): changed gown Lower Body Dressing: Moderate assistance;Sit to/from stand Lower Body Dressing Details (indicate cue type and reason): education on AE use for LB dressing with reacher and sock aide     Toileting- Clothing Manipulation and Hygiene: Moderate assistance;Sit to/from stand Toileting - Clothing Manipulation Details (indicate cue type and reason): required assistance for toilet hygiene while standing due to patient was unable to make it to [facility] and had BM in chair     Functional mobility during ADLs: Min guard;Rolling walker (2 wheels) General ADL Comments: performed AE training while seated in recliner  with patient having difficulty following directions for doffing socks with reacher    Extremity/Trunk Assessment              Vision        Perception     Praxis      Cognition Arousal/Alertness: Awake/alert Behavior During Therapy: WFL for tasks assessed/performed Overall Cognitive Status: Within Functional Limits for tasks assessed                                 General Comments: required frequent cues on AE use          Exercises Exercises: General Lower Extremity;Other exercises General Exercises - Lower Extremity Long Arc Quad: Both;10 reps;Seated Hip Flexion/Marching: Both;10 reps;Seated Toe Raises: Both;Seated;20 reps Other Exercises Other Exercises: x3 Serial sit to stands with unilateral hand support vs bilateral   Shoulder Instructions       General Comments      Pertinent Vitals/ Pain       Pain Assessment: Faces Faces Pain Scale: Hurts little more Pain Location: L hip, back Pain Descriptors / Indicators: Sore Pain Intervention(s): Monitored during session;Repositioned;Limited activity within patient's tolerance  Home Living                                          Prior Functioning/Environment              Frequency  Min 2X/week        Progress Toward Goals  OT Goals(current goals can now be found in the care plan section)  Progress towards OT goals: Progressing toward goals  Acute Rehab OT Goals Patient Stated Goal: get better to allow to be safe alone at home OT Goal Formulation: With patient Time For Goal Achievement: 07/19/21 Potential to Achieve Goals: Good ADL Goals Pt Will Perform Lower Body Bathing: with modified independence;sit to/from stand Pt Will Perform Lower Body Dressing: with modified independence;sitting/lateral leans;sit to/from stand;with adaptive equipment Pt Will Transfer to Toilet: with modified independence;ambulating Pt Will Perform Toileting - Clothing Manipulation and hygiene: with modified independence;sitting/lateral leans;sit to/from stand Additional ADL Goal #1: Pt to increase activity tolerance >7-10 min  during functional tasks to maximize endurance for ADLs/IADLs  Plan Discharge plan remains appropriate    Co-evaluation                 AM-PAC OT "6 Clicks" Daily Activity     Outcome Measure   Help from another person eating meals?: None Help from another person taking care of personal grooming?: A Little Help from another person toileting, which includes using toliet, bedpan, or urinal?: A Lot Help from another person bathing (including washing, rinsing, drying)?: A Lot Help from another person to put on and taking off regular upper body clothing?: A Little Help from another person to put on and taking off regular lower body clothing?: A Lot 6 Click Score: 16    End of Session Equipment Utilized During Treatment: Rolling walker (2 wheels)  OT Visit Diagnosis: Other abnormalities of gait and mobility (R26.89);Unsteadiness on feet (R26.81);Muscle weakness (generalized) (M62.81)   Activity Tolerance Patient tolerated treatment well   Patient Left in bed;with call bell/phone within reach;with bed alarm set   Nurse Communication Mobility status        Time: 3536-1443 OT Time Calculation (min): 32 min  Charges:  OT General Charges $OT Visit: 1 Visit OT Treatments $Self Care/Home Management : 8-22 mins  Alfonse Flavors, OTA Acute Rehabilitation Services  Pager [phone] Office [phone]   Dewain Penning 07/06/2021, 11:00 AM

## 2021-07-08 LAB — CULTURE, BLOOD (ROUTINE X 2)
Culture: NO GROWTH
Culture: NO GROWTH
Special Requests: ADEQUATE
Special Requests: ADEQUATE

## 2021-07-25 ENCOUNTER — Encounter (HOSPITAL_COMMUNITY): Payer: Self-pay | Admitting: Emergency Medicine

## 2021-07-25 ENCOUNTER — Emergency Department (HOSPITAL_COMMUNITY): Payer: Medicare Other

## 2021-07-25 DIAGNOSIS — Z515 Encounter for palliative care: Secondary | ICD-10-CM

## 2021-07-25 DIAGNOSIS — N400 Enlarged prostate without lower urinary tract symptoms: Secondary | ICD-10-CM | POA: Diagnosis present

## 2021-07-25 DIAGNOSIS — G931 Anoxic brain damage, not elsewhere classified: Secondary | ICD-10-CM | POA: Diagnosis present

## 2021-07-25 DIAGNOSIS — R570 Cardiogenic shock: Secondary | ICD-10-CM | POA: Diagnosis present

## 2021-07-25 DIAGNOSIS — K7200 Acute and subacute hepatic failure without coma: Secondary | ICD-10-CM | POA: Diagnosis present

## 2021-07-25 DIAGNOSIS — D649 Anemia, unspecified: Secondary | ICD-10-CM | POA: Diagnosis present

## 2021-07-25 DIAGNOSIS — G253 Myoclonus: Secondary | ICD-10-CM | POA: Diagnosis present

## 2021-07-25 DIAGNOSIS — J9601 Acute respiratory failure with hypoxia: Secondary | ICD-10-CM | POA: Diagnosis present

## 2021-07-25 DIAGNOSIS — N179 Acute kidney failure, unspecified: Secondary | ICD-10-CM | POA: Diagnosis present

## 2021-07-25 DIAGNOSIS — Z66 Do not resuscitate: Secondary | ICD-10-CM | POA: Diagnosis present

## 2021-07-25 DIAGNOSIS — Z20822 Contact with and (suspected) exposure to covid-19: Secondary | ICD-10-CM | POA: Diagnosis present

## 2021-07-25 DIAGNOSIS — Z79899 Other long term (current) drug therapy: Secondary | ICD-10-CM

## 2021-07-25 DIAGNOSIS — Z87891 Personal history of nicotine dependence: Secondary | ICD-10-CM

## 2021-07-25 DIAGNOSIS — I219 Acute myocardial infarction, unspecified: Secondary | ICD-10-CM | POA: Diagnosis not present

## 2021-07-25 DIAGNOSIS — R579 Shock, unspecified: Secondary | ICD-10-CM | POA: Insufficient documentation

## 2021-07-25 DIAGNOSIS — Z87440 Personal history of urinary (tract) infections: Secondary | ICD-10-CM

## 2021-07-25 DIAGNOSIS — E872 Acidosis, unspecified: Secondary | ICD-10-CM | POA: Diagnosis present

## 2021-07-25 DIAGNOSIS — I4891 Unspecified atrial fibrillation: Secondary | ICD-10-CM

## 2021-07-25 DIAGNOSIS — R569 Unspecified convulsions: Secondary | ICD-10-CM | POA: Diagnosis not present

## 2021-07-25 DIAGNOSIS — I10 Essential (primary) hypertension: Secondary | ICD-10-CM | POA: Diagnosis present

## 2021-07-25 DIAGNOSIS — I469 Cardiac arrest, cause unspecified: Secondary | ICD-10-CM | POA: Diagnosis present

## 2021-07-25 DIAGNOSIS — J9600 Acute respiratory failure, unspecified whether with hypoxia or hypercapnia: Secondary | ICD-10-CM | POA: Insufficient documentation

## 2021-07-25 LAB — CBC WITH DIFFERENTIAL/PLATELET
Abs Immature Granulocytes: 0.31 10*3/uL — ABNORMAL HIGH (ref 0.00–0.07)
Basophils Absolute: 0.1 10*3/uL (ref 0.0–0.1)
Basophils Relative: 1 %
Eosinophils Absolute: 0.3 10*3/uL (ref 0.0–0.5)
Eosinophils Relative: 2 %
HCT: 35.9 % — ABNORMAL LOW (ref 39.0–52.0)
Hemoglobin: 11.9 g/dL — ABNORMAL LOW (ref 13.0–17.0)
Immature Granulocytes: 2 %
Lymphocytes Relative: 31 %
Lymphs Abs: 4 10*3/uL (ref 0.7–4.0)
MCH: 31 pg (ref 26.0–34.0)
MCHC: 33.1 g/dL (ref 30.0–36.0)
MCV: 93.5 fL (ref 80.0–100.0)
Monocytes Absolute: 0.8 10*3/uL (ref 0.1–1.0)
Monocytes Relative: 6 %
Neutro Abs: 7.5 10*3/uL (ref 1.7–7.7)
Neutrophils Relative %: 58 %
Platelets: 289 10*3/uL (ref 150–400)
RBC: 3.84 MIL/uL — ABNORMAL LOW (ref 4.22–5.81)
RDW: 12.5 % (ref 11.5–15.5)
WBC: 13 10*3/uL — ABNORMAL HIGH (ref 4.0–10.5)
nRBC: 0 % (ref 0.0–0.2)

## 2021-07-25 LAB — COMPREHENSIVE METABOLIC PANEL
ALT: 396 U/L — ABNORMAL HIGH (ref 0–44)
AST: 320 U/L — ABNORMAL HIGH (ref 15–41)
Albumin: 2.6 g/dL — ABNORMAL LOW (ref 3.5–5.0)
Alkaline Phosphatase: 89 U/L (ref 38–126)
Anion gap: 12 (ref 5–15)
BUN: 24 mg/dL — ABNORMAL HIGH (ref 8–23)
CO2: 21 mmol/L — ABNORMAL LOW (ref 22–32)
Calcium: 8.6 mg/dL — ABNORMAL LOW (ref 8.9–10.3)
Chloride: 101 mmol/L (ref 98–111)
Creatinine, Ser: 1.2 mg/dL (ref 0.61–1.24)
GFR, Estimated: >60 mL/min (ref >60)
Glucose, Bld: 160 mg/dL — ABNORMAL HIGH (ref 70–99)
Potassium: 4.3 mmol/L (ref 3.5–5.1)
Sodium: 134 mmol/L — ABNORMAL LOW (ref 135–145)
Total Bilirubin: 0.2 mg/dL — ABNORMAL LOW (ref 0.3–1.2)
Total Protein: 6 g/dL — ABNORMAL LOW (ref 6.5–8.1)

## 2021-07-25 LAB — URINALYSIS, ROUTINE W REFLEX MICROSCOPIC
Bilirubin Urine: NEGATIVE
Glucose, UA: NEGATIVE mg/dL
Ketones, ur: NEGATIVE mg/dL
Nitrite: NEGATIVE
Protein, ur: 100 mg/dL — AB
RBC / HPF: >50 RBC/hpf — ABNORMAL HIGH (ref 0–5)
Specific Gravity, Urine: 1.015 (ref 1.005–1.030)
pH: 7 (ref 5.0–8.0)

## 2021-07-25 LAB — I-STAT ARTERIAL BLOOD GAS, ED
Acid-base deficit: 1 mmol/L (ref 0.0–2.0)
Bicarbonate: 24.9 mmol/L (ref 20.0–28.0)
Calcium, Ion: 1.16 mmol/L (ref 1.15–1.40)
HCT: 31 % — ABNORMAL LOW (ref 39.0–52.0)
Hemoglobin: 10.5 g/dL — ABNORMAL LOW (ref 13.0–17.0)
O2 Saturation: 100 %
Patient temperature: 97.7
Potassium: 4.1 mmol/L (ref 3.5–5.1)
Sodium: 134 mmol/L — ABNORMAL LOW (ref 135–145)
TCO2: 26 mmol/L (ref 22–32)
pCO2 arterial: 43.2 mmHg (ref 32.0–48.0)
pH, Arterial: 7.367 (ref 7.350–7.450)
pO2, Arterial: 407 mmHg — ABNORMAL HIGH (ref 83.0–108.0)

## 2021-07-25 LAB — I-STAT CHEM 8, ED
BUN: 25 mg/dL — ABNORMAL HIGH (ref 8–23)
Calcium, Ion: 1.09 mmol/L — ABNORMAL LOW (ref 1.15–1.40)
Chloride: 101 mmol/L (ref 98–111)
Creatinine, Ser: 1 mg/dL (ref 0.61–1.24)
Glucose, Bld: 152 mg/dL — ABNORMAL HIGH (ref 70–99)
HCT: 36 % — ABNORMAL LOW (ref 39.0–52.0)
Hemoglobin: 12.2 g/dL — ABNORMAL LOW (ref 13.0–17.0)
Potassium: 4.4 mmol/L (ref 3.5–5.1)
Sodium: 135 mmol/L (ref 135–145)
TCO2: 23 mmol/L (ref 22–32)

## 2021-07-25 LAB — LACTIC ACID, PLASMA: Lactic Acid, Venous: 5.4 mmol/L (ref 0.5–1.9)

## 2021-07-25 LAB — RESP PANEL BY RT-PCR (FLU A&B, COVID) ARPGX2
Influenza A by PCR: NEGATIVE
Influenza B by PCR: NEGATIVE
SARS Coronavirus 2 by RT PCR: NEGATIVE

## 2021-07-25 LAB — TROPONIN I (HIGH SENSITIVITY): Troponin I (High Sensitivity): 113 ng/L (ref <18)

## 2021-07-25 MED ORDER — SODIUM CHLORIDE 0.9 % IV SOLN: INTRAVENOUS | Status: DC | PRN

## 2021-07-25 MED ORDER — BUSPIRONE HCL 15 MG PO TABS: 30.0000 mg | ORAL_TABLET | Freq: Three times a day (TID) | ORAL | Status: DC | PRN

## 2021-07-25 MED ORDER — SODIUM CHLORIDE 0.9 % IV SOLN
2.0000 g | Freq: Three times a day (TID) | INTRAVENOUS | Status: DC
  Administered 2021-07-26: 2 g via INTRAVENOUS
  Filled 2021-07-25: qty 2

## 2021-07-25 MED ORDER — ACETAMINOPHEN 160 MG/5ML PO SOLN: 650.0000 mg | ORAL | Status: DC | PRN

## 2021-07-25 MED ORDER — ACETAMINOPHEN 325 MG PO TABS: 650.0000 mg | ORAL_TABLET | ORAL | Status: DC | PRN

## 2021-07-25 MED ORDER — SODIUM CHLORIDE 0.9 % IV BOLUS
1000.0000 mL | Freq: Once | INTRAVENOUS | Status: AC
  Administered 2021-07-25: 1000 mL via INTRAVENOUS

## 2021-07-25 MED ORDER — SODIUM CHLORIDE 0.9 % IV SOLN: 250.0000 mL | INTRAVENOUS | Status: DC

## 2021-07-25 MED ORDER — VANCOMYCIN HCL 1500 MG/300ML IV SOLN: 1500.0000 mg | INTRAVENOUS | Status: DC

## 2021-07-25 MED ORDER — PANTOPRAZOLE SODIUM 40 MG IV SOLR: 40.0000 mg | Freq: Every day | INTRAVENOUS | Status: DC

## 2021-07-25 MED ORDER — LACTATED RINGERS IV SOLN: INTRAVENOUS | Status: DC

## 2021-07-25 MED ORDER — SODIUM CHLORIDE 0.9 % IV SOLN
2.0000 g | Freq: Once | INTRAVENOUS | Status: AC
  Administered 2021-07-26: 2 g via INTRAVENOUS
  Filled 2021-07-25: qty 2

## 2021-07-25 MED ORDER — POLYETHYLENE GLYCOL 3350 17 G PO PACK: 17.0000 g | PACK | Freq: Every day | ORAL | Status: DC

## 2021-07-25 MED ORDER — DOCUSATE SODIUM 50 MG/5ML PO LIQD: 100.0000 mg | Freq: Two times a day (BID) | ORAL | Status: DC | PRN

## 2021-07-25 MED ORDER — ACETAMINOPHEN 650 MG RE SUPP: 650.0000 mg | RECTAL | Status: DC | PRN

## 2021-07-25 MED ORDER — ETOMIDATE 2 MG/ML IV SOLN
20.0000 mg | Freq: Once | INTRAVENOUS | Status: AC
  Administered 2021-07-25: 20 mg via INTRAVENOUS

## 2021-07-25 MED ORDER — NOREPINEPHRINE 4 MG/250ML-% IV SOLN
0.0000 ug/min | INTRAVENOUS | Status: DC
  Administered 2021-07-25: 2 ug/min via INTRAVENOUS

## 2021-07-25 MED ORDER — ONDANSETRON HCL 4 MG/2ML IJ SOLN: 4.0000 mg | Freq: Four times a day (QID) | INTRAMUSCULAR | Status: DC | PRN

## 2021-07-25 MED ORDER — DOCUSATE SODIUM 50 MG/5ML PO LIQD: 100.0000 mg | Freq: Two times a day (BID) | ORAL | Status: DC

## 2021-07-25 MED ORDER — FENTANYL CITRATE PF 50 MCG/ML IJ SOSY: 25.0000 ug | PREFILLED_SYRINGE | INTRAMUSCULAR | Status: DC | PRN

## 2021-07-25 MED ORDER — NOREPINEPHRINE 4 MG/250ML-% IV SOLN
2.0000 ug/min | INTRAVENOUS | Status: DC
  Administered 2021-07-26: 2 ug/min via INTRAVENOUS

## 2021-07-25 MED ORDER — POLYETHYLENE GLYCOL 3350 17 G PO PACK: 17.0000 g | PACK | Freq: Every day | ORAL | Status: DC | PRN

## 2021-07-25 MED ORDER — ROCURONIUM BROMIDE 50 MG/5ML IV SOLN
100.0000 mg | Freq: Once | INTRAVENOUS | Status: AC
  Administered 2021-07-25: 80 mg via INTRAVENOUS
  Filled 2021-07-25: qty 10

## 2021-07-25 MED ORDER — VANCOMYCIN HCL 1750 MG/350ML IV SOLN
1750.0000 mg | Freq: Once | INTRAVENOUS | Status: AC
  Administered 2021-07-26: 1750 mg via INTRAVENOUS
  Filled 2021-07-25 (×2): qty 350

## 2021-07-25 MED ORDER — HEPARIN SODIUM (PORCINE) 5000 UNIT/ML IJ SOLN: 5000.0000 [IU] | Freq: Three times a day (TID) | INTRAMUSCULAR | Status: DC

## 2021-07-25 MED ORDER — DOCUSATE SODIUM 100 MG PO CAPS: 100.0000 mg | ORAL_CAPSULE | Freq: Two times a day (BID) | ORAL | Status: DC | PRN

## 2021-07-25 NOTE — Progress Notes (Signed)
An USGPIV (ultrasound guided PIV) has been placed for short-term vasopressor infusion. A correctly placed ivWatch must be used when administering Vasopressors. Should this treatment be needed beyond 72 hours, central line access should be obtained.  It will be the responsibility of the bedside nurse to follow best practice to prevent extravasations.   Super-long 22g placed L forearm. Catheter tip is 1.5 cm deep on [place] view. Skin marked for IV watch placement. Discussed with RN.

## 2021-07-25 NOTE — Sepsis Progress Note (Signed)
Elink following code sepsis °

## 2021-07-25 NOTE — H&P (Addendum)
NAME:  Blake Medina, MRN:  [id number], DOB:  Nov 01, 1945, LOS: 0 ADMISSION DATE:  07/31/2021, CONSULTATION DATE:  07/07/2021 REFERRING MD:  Tamera Punt, CHIEF COMPLAINT:  cardiac arrest   History of Present Illness:  Patient is encephalopathic and/or intubated. Therefore history has been obtained from chart review.   Blake Medina, is a 76 y.o. male, who presented to the [facility] ED with a chief complaint of cardiac arrest.  Recently admitted to [facility] on 06/23/21-07/06/2021 with acute renal failure secondary to obstructive uropathy: bilateral hydronephrosis, hematuria in setting of BPH. UTI. Discharged to SNF with foley and keflex.  Per documentation, developed chest pain at SNF on 07/08/2021. Nitro given at [phone] with relief, next time seen found down. Unknown downtime, at most less than 1 hour since last seen per documentation. 5 rounds CPR, 1 shock. Afib with RVR on EMS arrival. Spontaneous resp per EMS. Hypotensive, started on epi GTT  ED course was notable for switch from king airway to ETT. Hypotensive. Epi changed to levophed. Lactate 5.4, troponin 113, afebrile, WBC 13.0. 12 lead: sinus vs atrial tachycardia, no significant ST changes noted at this time. Head CT with no acute hemmorhage.   PCCM was consulted for admission.   Pertinent  Medical History  BPH  Significant Hospital Events: Including procedures, antibiotic start and stop dates in addition to other pertinent events   1/22 Presented with [facility], unclear downtime. PCCM consult. Admit.  Interim History / Subjective:  See above  5 levophed  Unable to obtain subjective evaluation due to patient status  Objective   Blood pressure 110/61, pulse (!) 124, temperature 97.7 F (36.5 C), temperature source Temporal, resp. rate 18, height '6\' 2"'  (1.88 m), weight 85.5 kg, SpO2 100 %.    Vent Mode: PRVC FiO2 (%):  [100 %] 100 % Set Rate:  [18 bmp] 18 bmp Vt Set:  [650 mL] 650 mL PEEP:  [5 cmH20] 5 cmH20 Plateau Pressure:  [14 cmH20] 14 cmH20   No intake or output data in the 24 hours ending 07/17/2021 2107 Filed Weights   07/08/2021 2049  Weight: 85.5 kg    Examination: General: In bed, unresponsive, frail appearing HEENT: MM pink/moist, anicteric, atraumatic Neuro: RASS -5, PERRL 54m, GCS 3t, no c/c/g noted. No TOF available. CV: S1S2, ST, no m/r/g appreciated PULM:  rhonchi  in the upper lobes, rhonchi  in the lower lobes, trachea midline, chest expansion symmetric GI: soft, bsx4 hypoactive, non-tender   Extremities: cool/dry, no pretibial edema, capillary refill less than 3 seconds  Skin:  no rashes or lesions noted   Labs WBC 13.0 Hgb 11.9 NA 144 K 4.3 Glu 160 Creat 1.1>1.2 ALT 396 AST pending Bili 0.2 Troponin >113 Lactate>5.4 GLU 152 K 4.4 Creat 1  12lead: sinus vs atrial tachycardia, no significant ST changes noted at this time CXR: no pneumo, bibasilar atelectasis, no obvious infiltrate  Resolved Hospital Problem list     Assessment & Plan:  Out of hospital cardiac arrest, unknown rhythm, unknown downtime, at most less than 1 hour since last seen per documentation. Chest pain at SNF. Nitro given at [phone] relief, next time seen found down. 5 rounds CPR, 1 shock. Afib with RVR on EMS arrival. Spontaneous resp per EMS. Lactic acidosis- suspect secondary to above Shock, suspect cardiogenic, ? Arrythmia vs hypotension post nitro. WBC 13. Suspect reactive post CPR. No infiltrate on CXR. Recent admission for UTI. Possible infectious source. Troponin 113. Suspect elevated secondary to CPR, however pt CO chest pain. No significant  ST changes noted on 12 lead. 1 L IVF given in ED. Atrial fibrillation, suspect new onset- Found to be in afib by EMS. Unclear etiology. Cha2ds vasc score 2. Unclear how long in afib -Admit to ICU with continuous tele and SPO2 monitoring -Goal MAP greater than 65. -Goal normothermia.Target temperature between 36 and 37.8 degrees celsius. Notify provider if temperature goals are not  met. Place esophageal or bladder temperature probe. -Follow up CMP, MG, Phos, CBC, INR -Start PRN tylenol 631m q6h per tube or PR. -Trend troponin -Trend lactate -Follow up blood cultures, urine culture,  -Start broad spectrum antibiotics, Vanc and cefepime. Check PCT. S/P 1 L IVF, holding further fluid administration at this time due to possible cardiac component. -Repeat ABG one hour post ventilator initiation -Repeat EKG in 6 hours -Obtain ECHO -Strict I&O -trend troponin and lactate -Monitor fever/wbc curve -Holding on cards consult at this time. If troponin continues to rise/EKG changes will plan to consult. Holding on systemic AC.  Acute Metabolic Encephalopathy vs ?Anoxic Brain Injury Secondary to cardiac arrest. Unclear downtime. GCS 3 on exam. Unclear if 2ndary to paralytic vs neuro insult. No acute hemorrhage on CTH -Obtain EEG. Consider neurology consult. -Obtain head CT -Will consider MRI around 72 hours post arrest depending on clinical course -PAD bundle as discussed below. Goal RASS 0. Titrate medication to goal. -Frequent neuro exams -Continue neuroprotective measures: goal normothermia, euglycemia, HOB greater than 30 when able, head in neutral alignment, normocapnia, normoxia, normonatremia.   -Q4H CBG  Acute respiratory failure, unspecified Secondary to cardiac arrest -LTVV strategy with tidal volumes of 4-8 cc/kg ideal body weight -Goal plateau pressures less than 30 and driving pressures less than 15 -Wean PEEP/FiO2 for SpO2 92-98% -VAP bundle -Daily SAT and SBT -PAD bundle with  fentanyl push -RASS goal 0 to -1 -Check ABG  Transaminitis Suspect secondary to cardiac arrest - Trend hepatic function - avoid hepatotoxic agents -Supportive care  Normocytic Anemia HGB 11.9. No obvious source of bleeding. -Transfuse PRBC if HBG less than 7 -Obtain AM CBC to trend H&H -Monitor for signs of bleeding  BPH -place foley   Best Practice (right click and  "Reselect all SmartList Selections" daily)   Diet/type: NPO DVT prophylaxis: SCD GI prophylaxis: PPI Lines: Arterial Line and yes and it is still needed Foley:  Yes, and it is still needed Code Status:  limited Last date of multidisciplinary goals of care discussion [1/22 Discussed with Cousin Harmin glass over phone. No other family available per cousin. Patient would not want CPR again. Notified of concerning neurologic status. ]  Labs   CBC: Recent Labs  Lab 07/08/2021 2033 07/10/2021 2040  WBC 13.0*  --   NEUTROABS 7.5  --   HGB 11.9* 12.2*  HCT 35.9* 36.0*  MCV 93.5  --   PLT 289  --     Basic Metabolic Panel: Recent Labs  Lab 07/12/2021 2040  NA 135  K 4.4  CL 101  GLUCOSE 152*  BUN 25*  CREATININE 1.00   GFR: Estimated Creatinine Clearance: 71.9 mL/min (by C-G formula based on SCr of 1 mg/dL). Recent Labs  Lab 07/20/2021 2033  WBC 13.0*    Liver Function Tests: No results for input(s): AST, ALT, ALKPHOS, BILITOT, PROT, ALBUMIN in the last 168 hours. No results for input(s): LIPASE, AMYLASE in the last 168 hours. No results for input(s): AMMONIA in the last 168 hours.  ABG    Component Value Date/Time   TCO2 23 07/23/2021 2040  Coagulation Profile: No results for input(s): INR, PROTIME in the last 168 hours.  Cardiac Enzymes: No results for input(s): CKTOTAL, CKMB, CKMBINDEX, TROPONINI in the last 168 hours.  HbA1C: No results found for: HGBA1C  CBG: No results for input(s): GLUCAP in the last 168 hours.  Review of Systems:   Unable to obtain ROS due to patient status.  Past Medical History:  He,  has no past medical history on file.   Surgical History:  History reviewed. No pertinent surgical history.   Social History:   reports that he has quit smoking. His smoking use included cigarettes. He has never used smokeless tobacco. He reports that he does not currently use alcohol. He reports that he does not use drugs.   Family History:   His family history is not on file.   Allergies Not on File   Home Medications  Prior to Admission medications   Medication Sig Start Date End Date Taking? Authorizing Provider  finasteride (PROSCAR) 5 MG tablet Take 1 tablet (5 mg total) by mouth daily. 07/07/21   Elmarie Shiley, MD     Critical care time: 42 minutes     Redmond [employer]., MSN, APRN, AGACNP-BC [facility]  07/10/2021 , 10:14 PM  Please see [email or web address] for pager details  If no response, please call [phone] After hours, please call Elink at [phone]

## 2021-07-25 NOTE — ED Provider Notes (Signed)
[facility] EMERGENCY DEPARTMENT Provider Note   CSN: [id number] Arrival date & time: 07/13/2021  2024     History  Chief Complaint  Patient presents with   Post-CPR    Blake Medina is a 76 y.o. male.  ThisPatient is a 76 year old male who presents cardiac arrest.  Per chart review, he was recently admitted earlier this month for acute renal failure in association with urinary retention.  Apparently he had not received regular health care prior to this but he did not have any other known medical problems.  [facility]  Per EMS, the nursing home reports that patient was complaining of some chest pain earlier today.  He did get a nitroglycerin.  Later, they found him unresponsive and CPR was started.  On fire arrival, CPR was continued and AED gave 1 shock.  By the time EMS arrived, patient had pulses back.  He had a King airway placed by fire.  Per EMS, he been intermittently going between sinus tach and atrial fibrillation.  He was having some spontaneous respirations.  He was noted to be hypotensive and was started on epi drip by EMS.      Home Medications Prior to Admission medications   Medication Sig Start Date End Date Taking? Authorizing Provider  buPROPion (WELLBUTRIN XL) 150 MG 24 hr tablet Take 150 mg by mouth daily.   Yes [provider]  finasteride (PROSCAR) 5 MG tablet Take 1 tablet (5 mg total) by mouth daily. 07/07/21  Yes Regalado, Cassie Freer, MD  [employer] [facility]) OINT Apply 1 application topically See admin instructions. Apply to scrotum buttocks sacrum every shift for wound healing   Yes [provider]  midodrine (PROAMATINE) 10 MG tablet Take 10 mg by mouth every 8 (eight) hours.   Yes [provider]  traZODone (DESYREL) 50 MG tablet Take 50 mg by mouth See admin instructions. Every 14 hours as needed for insomnia   Yes [provider]      Allergies    Patient has no  known allergies.    Review of Systems   Review of Systems  Unable to perform ROS: Mental status change   Physical Exam Updated Vital Signs BP 115/66    Pulse (!) 107    Temp 97.7 F (36.5 C) (Temporal)    Resp (!) 24    Ht 6\' 2"  (1.88 m)    Wt 85.5 kg    SpO2 100%    BMI 24.20 kg/m  Physical Exam Constitutional:      Appearance: He is well-developed.     Comments: Unresponsive  HENT:     Head: Normocephalic and atraumatic.  Eyes:     Comments: Pupils small but equal bilaterally  Neck:     Comments: Severe kyphosis Cardiovascular:     Rate and Rhythm: Regular rhythm. Tachycardia present.     Heart sounds: Normal heart sounds.  Pulmonary:     Effort: No respiratory distress.     Breath sounds: No wheezing or rales.     Comments: Patient is being ventilated through a King airway.  He does have some spontaneous respirations Chest:     Chest wall: No tenderness.  Abdominal:     General: Bowel sounds are normal.     Palpations: Abdomen is soft.     Tenderness: There is no abdominal tenderness. There is no guarding or rebound.  Musculoskeletal:        General: Normal  range of motion.     Cervical back: Normal range of motion and neck supple.     Comments: No edema  Lymphadenopathy:     Cervical: No cervical adenopathy.  Skin:    General: Skin is warm and dry.     Findings: No rash.  Neurological:     Comments: Unresponsive    ED Results / Procedures / Treatments   Labs (all labs ordered are listed, but only abnormal results are displayed) Labs Reviewed  CBC WITH DIFFERENTIAL/PLATELET - Abnormal; Notable for the following components:      Result Value   WBC 13.0 (*)    RBC 3.84 (*)    Hemoglobin 11.9 (*)    HCT 35.9 (*)    Abs Immature Granulocytes 0.31 (*)    All other components within normal limits  COMPREHENSIVE METABOLIC PANEL - Abnormal; Notable for the following components:   Sodium 134 (*)    CO2 21 (*)    Glucose, Bld 160 (*)    BUN 24 (*)    Calcium  8.6 (*)    Total Protein 6.0 (*)    Albumin 2.6 (*)    AST 320 (*)    ALT 396 (*)    Total Bilirubin 0.2 (*)    All other components within normal limits  LACTIC ACID, PLASMA - Abnormal; Notable for the following components:   Lactic Acid, Venous 5.4 (*)    All other components within normal limits  I-STAT CHEM 8, ED - Abnormal; Notable for the following components:   BUN 25 (*)    Glucose, Bld 152 (*)    Calcium, Ion 1.09 (*)    Hemoglobin 12.2 (*)    HCT 36.0 (*)    All other components within normal limits  TROPONIN I (HIGH SENSITIVITY) - Abnormal; Notable for the following components:   Troponin I (High Sensitivity) 113 (*)    All other components within normal limits  RESP PANEL BY RT-PCR (FLU A&B, COVID) ARPGX2  CULTURE, BLOOD (ROUTINE X 2)  CULTURE, BLOOD (ROUTINE X 2)  LACTIC ACID, PLASMA  URINALYSIS, ROUTINE W REFLEX MICROSCOPIC  STREP PNEUMONIAE URINARY ANTIGEN  LEGIONELLA PNEUMOPHILA SEROGP 1 UR AG  BLOOD GAS, ARTERIAL  CBC  BASIC METABOLIC PANEL  MAGNESIUM  PHOSPHORUS  PROCALCITONIN  PROCALCITONIN  TROPONIN I (HIGH SENSITIVITY)    EKG EKG Interpretation  Date/Time:  Sunday July 25 2021 20:42:24 EST Ventricular Rate:  141 PR Interval:  96 QRS Duration: 80 QT Interval:  271 QTC Calculation: 415 R Axis:   35 Text Interpretation: Sinus or ectopic atrial tachycardia Atrial premature complex Probable left atrial enlargement Repolarization abnormality, prob rate related Confirmed by Malvin Johns [phone]) on 07/04/2021 9:00:16 PM  Radiology CT Head Wo Contrast  Result Date: 07/09/2021 CLINICAL DATA:  Mental status change, unknown cause. EXAM: CT HEAD WITHOUT CONTRAST TECHNIQUE: Contiguous axial images were obtained from the base of the skull through the vertex without intravenous contrast. RADIATION DOSE REDUCTION: This exam was performed according to the departmental dose-optimization program which includes automated exposure control, adjustment of the mA  and/or kV according to patient size and/or use of iterative reconstruction technique. COMPARISON:  None. FINDINGS: Brain: No acute intracranial hemorrhage, midline shift or mass effect. No extra-axial fluid collection. Diffuse atrophy is noted. Periventricular white matter hypodensities are seen bilaterally. There are focal hypodensities in the corona radiata on the right and basal ganglia bilaterally, possible lacunar infarcts of indeterminate age. No hydrocephalus. Vascular: No hyperdense vessel or unexpected calcification.  Skull: Normal. Negative for fracture or focal lesion. Sinuses/Orbits: No acute finding. Other: None. IMPRESSION: 1. No acute intracranial hemorrhage. 2. Atrophy with chronic microvascular ischemic changes. There are scattered hypodensities in the corona radiata on the right and basal ganglia bilaterally, suggesting lacunar infarcts of indeterminate age. Comparison with older imaging studies or MRI is suggested for follow-up. Electronically Signed   By: Brett Fairy M.D.   On: 07/11/2021 22:04   DG Chest Portable 1 View  Result Date: 08/03/2021 CLINICAL DATA:  Status post CPR and intubation. EXAM: PORTABLE CHEST 1 VIEW COMPARISON:  None. FINDINGS: Endotracheal tube with tip approximately 15 mm above the carina. Recommend retraction by 3 cm for optimal positioning. Enteric tube extends below the diaphragm with tip beyond the inferior margin of the image. Minimal bibasilar atelectasis. Faint bilateral streaky densities may represent atelectasis. Pulmonary contusion or atypical infection is not excluded. No focal consolidation, pleural effusion, or pneumothorax. The cardiac silhouette is within normal limits. No acute osseous pathology. IMPRESSION: 1. Endotracheal tube with tip above the carina. Recommend retraction by 3 cm for optimal positioning. 2. No consolidation. Electronically Signed   By: Anner Crete M.D.   On: 07/08/2021 20:50    Procedures Procedure Name:  Intubation Date/Time: 07/20/2021 10:21 PM Performed by: Malvin Johns, MD Pre-anesthesia Checklist: Suction available, Patient identified, Patient being monitored and Emergency Drugs available Oxygen Delivery Method: Ambu bag Preoxygenation: Pre-oxygenation with 100% oxygen Induction Type: Rapid sequence Ventilation: Mask ventilation without difficulty Laryngoscope Size: Glidescope and 3 Grade View: Grade I Tube type: Subglottic suction tube Tube size: 7.5 mm Number of attempts: 1 Placement Confirmation: ETT inserted through vocal cords under direct vision, Positive ETCO2 and Breath sounds checked- equal and bilateral Tube secured with: ETT holder Dental Injury: Teeth and Oropharynx as per pre-operative assessment        Medications Ordered in ED Medications  0.9 %  sodium chloride infusion (has no administration in time range)  polyethylene glycol (MIRALAX / GLYCOLAX) packet 17 g (has no administration in time range)  docusate (COLACE) 50 MG/5ML liquid 100 mg (has no administration in time range)  polyethylene glycol (MIRALAX / GLYCOLAX) packet 17 g (has no administration in time range)  ondansetron (ZOFRAN) injection 4 mg (has no administration in time range)  acetaminophen (TYLENOL) tablet 650 mg (has no administration in time range)    Or  acetaminophen (TYLENOL) 160 MG/5ML solution 650 mg (has no administration in time range)    Or  acetaminophen (TYLENOL) suppository 650 mg (has no administration in time range)  busPIRone (BUSPAR) tablet 30 mg (has no administration in time range)    Or  busPIRone (BUSPAR) tablet 30 mg (has no administration in time range)  0.9 %  sodium chloride infusion (has no administration in time range)  heparin injection 5,000 Units (has no administration in time range)  lactated ringers infusion (has no administration in time range)  pantoprazole (PROTONIX) injection 40 mg (has no administration in time range)  0.9 %  sodium chloride infusion  (has no administration in time range)  norepinephrine (LEVOPHED) 4mg  in 230mL (0.016 mg/mL) premix infusion (2 mcg/min Intravenous Rate/Dose Change 07/20/2021 2203)  fentaNYL (SUBLIMAZE) injection 25 mcg (has no administration in time range)  fentaNYL (SUBLIMAZE) injection 25-100 mcg (has no administration in time range)  docusate (COLACE) 50 MG/5ML liquid 100 mg (has no administration in time range)  etomidate (AMIDATE) injection 20 mg (20 mg Intravenous Given 07/24/2021 2035)  rocuronium (ZEMURON) injection 100 mg (80 mg Intravenous Given  07/28/2021 2035)  sodium chloride 0.9 % bolus 1,000 mL (0 mLs Intravenous Stopped 08/03/2021 2100)    ED Course/ Medical Decision Making/ A&P                           Medical Decision Making Amount and/or Complexity of Data Reviewed Independent Historian: EMS External Data Reviewed: labs and notes. Labs: ordered. Decision-making details documented in ED Course. Radiology: ordered and independent interpretation performed. Decision-making details documented in ED Course. ECG/medicine tests: ordered and independent interpretation performed. Decision-making details documented in ED Course.  Risk Parenteral controlled substances. Drug therapy requiring intensive monitoring for toxicity. Decision regarding hospitalization.  Critical Care Total time providing critical care: 30-74 minutes  Patient is a 76 year old male who presents status post cardiac arrest.  He was hypotensive on arrival.  He was continued on the EMS epi drip and given a bolus of IV fluid.  His blood pressure improved.  His King airway was removed and he was intubated on 1 attempt.  He had a couple more drops in his blood pressure and was started on Levophed.  The EMS epi drip was discontinued.  His chest x-ray was performed which shows intact ET tube although it needs to be retracted somewhat.  This was retracted by respiratory.  This was reviewed by me as well.  His labs show an elevated troponin.   EKG shows atrial fibrillation with some RVR.  He is going in and out of a sinus rhythm versus atrial fibrillation.  Suspect some of the rapid rate is due to the pressors that it were started.  I do not see that he has a history of atrial fibrillation per chart review.  He had a head CT which shows no acute abnormality.  His lactic acid is elevated which is likely from his cardiac arrest and cardiac failure rather than sepsis.  He is afebrile.  I consulted with CCM who will admit the patient for further treatment.  Final Clinical Impression(s) / ED Diagnoses Final diagnoses:  Cardiac arrest ([place])  Acute respiratory failure, unspecified whether with hypoxia or hypercapnia (HCC)  Lactic acidosis  New onset atrial fibrillation [facility])    Rx / DC Orders ED Discharge Orders     None         Malvin Johns, MD 07/26/2021 2227

## 2021-07-25 NOTE — Progress Notes (Signed)
ANTICOAGULATION CONSULT NOTE - Initial Consult  Pharmacy Consult for heparin Indication: atrial fibrillation  No Known Allergies  Patient Measurements: Height: 6\' 2"  (188 cm) Weight: 85.5 kg (188 lb 7.9 oz) IBW/kg (Calculated) : 82.2  Vital Signs: Temp: 97.7 F (36.5 C) (01/22 2031) Temp Source: Temporal (01/22 2031) BP: 108/61 (01/22 2300) Pulse Rate: 96 (01/22 2300)  Labs: Recent Labs    07/31/2021 2033 08/03/2021 2040 07/23/2021 2235  HGB 11.9* 12.2* 10.5*  HCT 35.9* 36.0* 31.0*  PLT 289  --   --   CREATININE 1.20 1.00  --   TROPONINIHS 113*  --   --     Estimated Creatinine Clearance: 71.9 mL/min (by C-G formula based on SCr of 1 mg/dL).   Medical History: History reviewed. No pertinent past medical history.  Medications:  (Not in a hospital admission)   Assessment: 17 YOM who presented with cardiac arrest obtain ROSC. Pharmacy consulted to start IV heparin for new onset AFib.   H/H low, Plt wnl, SCr wnl   Goal of Therapy:  Heparin level 0.3-0.7 units/ml Monitor platelets by anticoagulation protocol: Yes   Plan:  -Heparin 4000 units IV bolus followed by heparin 1300 units/hr -F/u 8 hr HL -Monitor daily HL, CBC and s/s of bleeding   Albertina Parr, PharmD., BCPS, BCCCP Clinical Pharmacist Please refer to [facility] for unit-specific pharmacist

## 2021-07-25 NOTE — Progress Notes (Signed)
Patient was transported to 3M04 from ER via the ventilator with no complications.

## 2021-07-25 NOTE — Progress Notes (Signed)
RT pulled ETT back 3cm per MD.

## 2021-07-25 NOTE — Progress Notes (Signed)
Patient transported to CT and back to Trauma A via the ventilator with no complications.

## 2021-07-25 NOTE — ED Triage Notes (Signed)
Pt in from [facility] via [facility] as post-CPR. EMS reports pt called out for cp at facility around 1830 this evening, was given 1 NTG tablet, some relief. Pt became unresponsive within the hour and CPR was started by fire department - ROSC achieved after 5 rounds, and 1 shock. When EMS arrived, pt was in rapid afib (hx of). Given Epi gtt en route for hypotension. Arrives w/King airway in place

## 2021-07-25 NOTE — Procedures (Signed)
Arterial Catheter Insertion Procedure Note  Blake Medina  [id number]  1946-02-28  Date:07/15/2021  Time:9:54 PM    Provider Performing: Graciella Freer    Procedure: Insertion of Arterial Line [phone]) without US guidance  Indication(s) Blood pressure monitoring and/or need for frequent ABGs  Consent Unable to obtain consent due to emergent nature of procedure.  Anesthesia None   Time Out Verified patient identification, verified procedure, site/side was marked, verified correct patient position, special equipment/implants available, medications/allergies/relevant history reviewed, required imaging and test results available.   Sterile Technique Maximal sterile technique including full sterile barrier drape, hand hygiene, sterile gown, sterile gloves, mask, hair covering, sterile ultrasound probe cover (if used).   Procedure Description Area of catheter insertion was cleaned with chlorhexidine and draped in sterile fashion. Without real-time ultrasound guidance an arterial catheter was placed into the right radial artery.  Appropriate arterial tracings confirmed on monitor.     Complications/Tolerance None; patient tolerated the procedure well.   EBL Minimal   Specimen(s) None

## 2021-07-25 NOTE — Progress Notes (Signed)
Pharmacy Antibiotic Note  Blake Medina is a 76 y.o. male admitted on 08/17/2021 with sepsis.  Pharmacy has been consulted for vancomycin dosing.  Plan: Vancomycin 1750mg  x1 then 1500mg  IV Q124H. Goal AUC 400-550.  Expected AUC 450.  SCr 1.2. Cefepime 2g IV Q8H.  Height: 6\' 2"  (188 cm) Weight: 85.5 kg (188 lb 7.9 oz) IBW/kg (Calculated) : 82.2  Temp (24hrs), Avg:97.7 F (36.5 C), Min:97.7 F (36.5 C), Max:97.7 F (36.5 C)  Recent Labs  Lab August 17, 2021 2033 17-Aug-2021 2040  WBC 13.0*  --   CREATININE 1.20 1.00  LATICACIDVEN 5.4*  --     Estimated Creatinine Clearance: 71.9 mL/min (by C-G formula based on SCr of 1 mg/dL).    No Known Allergies   Thank you for allowing pharmacy to be a part of this patients care.  08/02/2021, PharmD, BCPS  08-17-21 10:51 PM

## 2021-07-26 ENCOUNTER — Inpatient Hospital Stay (HOSPITAL_COMMUNITY): Payer: Medicare Other

## 2021-07-26 DIAGNOSIS — J9601 Acute respiratory failure with hypoxia: Secondary | ICD-10-CM

## 2021-07-26 DIAGNOSIS — I469 Cardiac arrest, cause unspecified: Secondary | ICD-10-CM

## 2021-07-26 DIAGNOSIS — R569 Unspecified convulsions: Secondary | ICD-10-CM | POA: Diagnosis not present

## 2021-07-26 LAB — BASIC METABOLIC PANEL
Anion gap: 10 (ref 5–15)
BUN: 25 mg/dL — ABNORMAL HIGH (ref 8–23)
CO2: 20 mmol/L — ABNORMAL LOW (ref 22–32)
Calcium: 7.8 mg/dL — ABNORMAL LOW (ref 8.9–10.3)
Chloride: 104 mmol/L (ref 98–111)
Creatinine, Ser: 1.06 mg/dL (ref 0.61–1.24)
GFR, Estimated: >60 mL/min (ref >60)
Glucose, Bld: 173 mg/dL — ABNORMAL HIGH (ref 70–99)
Potassium: 4.3 mmol/L (ref 3.5–5.1)
Sodium: 134 mmol/L — ABNORMAL LOW (ref 135–145)

## 2021-07-26 LAB — POCT I-STAT 7, (LYTES, BLD GAS, ICA,H+H)
Acid-base deficit: 1 mmol/L (ref 0.0–2.0)
Bicarbonate: 22.7 mmol/L (ref 20.0–28.0)
Calcium, Ion: 1.16 mmol/L (ref 1.15–1.40)
HCT: 30 % — ABNORMAL LOW (ref 39.0–52.0)
Hemoglobin: 10.2 g/dL — ABNORMAL LOW (ref 13.0–17.0)
O2 Saturation: 100 %
Patient temperature: 97.9
Potassium: 4.5 mmol/L (ref 3.5–5.1)
Sodium: 136 mmol/L (ref 135–145)
TCO2: 24 mmol/L (ref 22–32)
pCO2 arterial: 33.4 mmHg (ref 32.0–48.0)
pH, Arterial: 7.438 (ref 7.350–7.450)
pO2, Arterial: 174 mmHg — ABNORMAL HIGH (ref 83.0–108.0)

## 2021-07-26 LAB — CBC
HCT: 33.3 % — ABNORMAL LOW (ref 39.0–52.0)
Hemoglobin: 11.1 g/dL — ABNORMAL LOW (ref 13.0–17.0)
MCH: 30.8 pg (ref 26.0–34.0)
MCHC: 33.3 g/dL (ref 30.0–36.0)
MCV: 92.5 fL (ref 80.0–100.0)
Platelets: 270 10*3/uL (ref 150–400)
RBC: 3.6 MIL/uL — ABNORMAL LOW (ref 4.22–5.81)
RDW: 12.3 % (ref 11.5–15.5)
WBC: 13.1 10*3/uL — ABNORMAL HIGH (ref 4.0–10.5)
nRBC: 0 % (ref 0.0–0.2)

## 2021-07-26 LAB — ECHOCARDIOGRAM COMPLETE
AR max vel: 3.13 cm2
AV Area VTI: 3.05 cm2
AV Area mean vel: 3.1 cm2
AV Mean grad: 5 mmHg
AV Peak grad: 8.6 mmHg
Ao pk vel: 1.47 m/s
Area-P 1/2: 4.1 cm2
Height: 74 in
S' Lateral: 3.5 cm
Weight: 3410.96 oz

## 2021-07-26 LAB — MAGNESIUM: Magnesium: 1.9 mg/dL (ref 1.7–2.4)

## 2021-07-26 LAB — TROPONIN I (HIGH SENSITIVITY): Troponin I (High Sensitivity): 1614 ng/L (ref <18)

## 2021-07-26 LAB — GLUCOSE, CAPILLARY
Glucose-Capillary: 142 mg/dL — ABNORMAL HIGH (ref 70–99)
Glucose-Capillary: 147 mg/dL — ABNORMAL HIGH (ref 70–99)
Glucose-Capillary: 188 mg/dL — ABNORMAL HIGH (ref 70–99)

## 2021-07-26 LAB — LACTIC ACID, PLASMA: Lactic Acid, Venous: 3.2 mmol/L (ref 0.5–1.9)

## 2021-07-26 LAB — MRSA NEXT GEN BY PCR, NASAL: MRSA by PCR Next Gen: NOT DETECTED

## 2021-07-26 LAB — PHOSPHORUS: Phosphorus: 2.6 mg/dL (ref 2.5–4.6)

## 2021-07-26 LAB — PROCALCITONIN
Procalcitonin: 0.18 ng/mL
Procalcitonin: 0.46 ng/mL

## 2021-07-26 LAB — HEPARIN LEVEL (UNFRACTIONATED): Heparin Unfractionated: 0.47 IU/mL (ref 0.30–0.70)

## 2021-07-26 MED ORDER — IPRATROPIUM-ALBUTEROL 0.5-2.5 (3) MG/3ML IN SOLN: 3.0000 mL | Freq: Four times a day (QID) | RESPIRATORY_TRACT | Status: DC | PRN

## 2021-07-26 MED ORDER — MORPHINE BOLUS VIA INFUSION
5.0000 mg | INTRAVENOUS | Status: DC | PRN
  Administered 2021-07-26: 5 mg via INTRAVENOUS
  Filled 2021-07-26: qty 5

## 2021-07-26 MED ORDER — GLYCOPYRROLATE 0.2 MG/ML IJ SOLN: 0.2000 mg | INTRAMUSCULAR | Status: DC | PRN

## 2021-07-26 MED ORDER — CHLORHEXIDINE GLUCONATE CLOTH 2 % EX PADS
6.0000 | MEDICATED_PAD | Freq: Every day | CUTANEOUS | Status: DC
  Administered 2021-07-26: 6 via TOPICAL

## 2021-07-26 MED ORDER — POLYVINYL ALCOHOL 1.4 % OP SOLN
1.0000 [drp] | Freq: Four times a day (QID) | OPHTHALMIC | Status: DC | PRN
  Filled 2021-07-26: qty 15

## 2021-07-26 MED ORDER — GLYCOPYRROLATE 1 MG PO TABS
1.0000 mg | ORAL_TABLET | ORAL | Status: DC | PRN
  Filled 2021-07-26: qty 1

## 2021-07-26 MED ORDER — GLYCOPYRROLATE 0.2 MG/ML IJ SOLN
0.2000 mg | INTRAMUSCULAR | Status: DC | PRN
  Administered 2021-07-26 – 2021-07-27 (×4): 0.2 mg via INTRAVENOUS
  Filled 2021-07-26 (×4): qty 1

## 2021-07-26 MED ORDER — ORAL CARE MOUTH RINSE
15.0000 mL | OROMUCOSAL | Status: DC
  Administered 2021-07-26 (×3): 15 mL via OROMUCOSAL

## 2021-07-26 MED ORDER — ACETAMINOPHEN 650 MG RE SUPP: 650.0000 mg | Freq: Four times a day (QID) | RECTAL | Status: DC | PRN

## 2021-07-26 MED ORDER — FENTANYL CITRATE (PF) 100 MCG/2ML IJ SOLN: 25.0000 ug | INTRAMUSCULAR | Status: DC | PRN

## 2021-07-26 MED ORDER — HEPARIN BOLUS VIA INFUSION
4000.0000 [IU] | Freq: Once | INTRAVENOUS | Status: AC
  Administered 2021-07-26: 4000 [IU] via INTRAVENOUS
  Filled 2021-07-26: qty 4000

## 2021-07-26 MED ORDER — LEVETIRACETAM IN NACL 1000 MG/100ML IV SOLN
1000.0000 mg | Freq: Two times a day (BID) | INTRAVENOUS | Status: DC
  Administered 2021-07-26: 1000 mg via INTRAVENOUS
  Filled 2021-07-26: qty 100

## 2021-07-26 MED ORDER — ACETAMINOPHEN 325 MG PO TABS: 650.0000 mg | ORAL_TABLET | Freq: Four times a day (QID) | ORAL | Status: DC | PRN

## 2021-07-26 MED ORDER — DEXTROSE 5 % IV SOLN: INTRAVENOUS | Status: DC

## 2021-07-26 MED ORDER — PROPOFOL 1000 MG/100ML IV EMUL
5.0000 ug/kg/min | INTRAVENOUS | Status: DC
  Administered 2021-07-26 (×2): 45 ug/kg/min via INTRAVENOUS
  Administered 2021-07-26: 10 ug/kg/min via INTRAVENOUS
  Filled 2021-07-26 (×3): qty 100

## 2021-07-26 MED ORDER — MORPHINE 100MG IN NS 100ML (1MG/ML) PREMIX INFUSION
0.0000 mg/h | INTRAVENOUS | Status: DC
  Administered 2021-07-26: 15 mg/h via INTRAVENOUS
  Administered 2021-07-26: 5 mg/h via INTRAVENOUS
  Administered 2021-07-27: 11:00:00 15 mg/h via INTRAVENOUS
  Filled 2021-07-26 (×5): qty 100

## 2021-07-26 MED ORDER — CHLORHEXIDINE GLUCONATE 0.12% ORAL RINSE (MEDLINE KIT)
15.0000 mL | Freq: Two times a day (BID) | OROMUCOSAL | Status: DC
  Administered 2021-07-26 (×2): 15 mL via OROMUCOSAL

## 2021-07-26 MED ORDER — DIPHENHYDRAMINE HCL 50 MG/ML IJ SOLN: 25.0000 mg | INTRAMUSCULAR | Status: DC | PRN

## 2021-07-26 MED ORDER — HEPARIN (PORCINE) 25000 UT/250ML-% IV SOLN
1300.0000 [IU]/h | INTRAVENOUS | Status: DC
  Administered 2021-07-26: 1300 [IU]/h via INTRAVENOUS
  Filled 2021-07-26: qty 250

## 2021-07-26 MED ORDER — MORPHINE SULFATE (PF) 2 MG/ML IV SOLN
2.0000 mg | INTRAVENOUS | Status: DC | PRN
  Administered 2021-07-26: 2 mg via INTRAVENOUS

## 2021-07-26 NOTE — Progress Notes (Signed)
NAME:  Blake Medina, MRN:  [id number], DOB:  May 21, 1946, LOS: 1 ADMISSION DATE:  08/02/2021, CONSULTATION DATE:  1/22 REFERRING MD:  Tamera Punt, CHIEF COMPLAINT:  Cardiac arrest   History of Present Illness:  76 y/o male who came to the [facility] ED with cardiac arrest after suffering chest pain at his nursing home.  On arrival he had a King airawy in place and was in Atrial fibrillation with RVR.  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 rate (!) 25, height 6\' 2"  (1.88 m), weight 96.7 kg, SpO2 100 %.    Vent Mode: PRVC FiO2 (%):  [40 %-100 %] 40 % Set Rate:  [18 bmp] 18 bmp Vt Set:  [490 mL-650 mL] 490 mL PEEP:  [5 cmH20] 5 cmH20 Plateau Pressure:  [10 cmH20-14 cmH20] 10 cmH20   Intake/Output Summary (Last 24 hours) at 07/26/2021 0735 Last data filed at 07/26/2021 [id number] Gross per 24 hour  Intake 2086.51 ml  Output 500 ml  Net 1586.51 ml   Filed Weights   07/16/2021 2049 07/26/21 0500  Weight: 85.5 kg 96.7 kg    Examination:  General:  In bed on vent HENT: NCAT ETT in place PULM: CTA B, vent supported breathing CV: RRR, no mgr GI: BS+, soft, nontender MSK: normal bulk and  tone Neuro: no response to painful stimuli or voice on my exam   Assessment & Plan:  Out of hospital cardiac arrest, unknown rhythm Cardiogenic shock Wean off levophed for goal MAP > 65 Tele  Atrial fibrillation with RVR Tele Heparin infusion  Acute metabolic encephalopathy vs Anoxic brain injury D/c TTM protocol because his prognosis is poor  Acute respiratory failure with hypoxemia Full mechanical vent support VAP prevention Daily WUA/SBT  Shock liver Monitor LFT prn  Normocytic anemia Monitor for bleeding Transfuse PRBC for Hgb < 7 gm/dL  BPH Foley  Overall prognosis is poor I updated his cousin Mr. Geoffery Lyons by phone today He says he is returning to discuss further    Best Practice (right click and "Reselect all SmartList Selections" daily)   Diet/type: tubefeeds DVT prophylaxis: systemic heparin GI prophylaxis: PPI Lines: N/A Foley:  Yes, and it is still needed Code Status:  limited Last date of multidisciplinary goals of care discussion [1/23, started the conversation with his cousin, recommended withdrawal of care. He plans to come in today in person for [place] to discuss further]  Labs   CBC: Recent Labs  Lab 07/16/2021 2033 07/26/2021 2040 07/14/2021 2235 07/26/21 0055 07/26/21 0431  WBC 13.0*  --   --  13.1*  --   NEUTROABS 7.5  --   --   --   --   HGB 11.9* 12.2* 10.5* 11.1* 10.2*  HCT 35.9* 36.0* 31.0* 33.3* 30.0*  MCV 93.5  --   --  92.5  --   PLT 289  --   --  270  --     Basic Metabolic Panel: Recent Labs  Lab 07/08/2021 2033 07/17/2021 2040 07/10/2021 2235 07/26/21 0055 07/26/21 0431  NA 134* 135 134* 134* 136  K 4.3 4.4 4.1 4.3 4.5  CL 101 101  --  104  --   CO2 21*  --   --  20*  --   GLUCOSE 160* 152*  --  173*  --   BUN 24* 25*  --  25*  --   CREATININE 1.20 1.00  --  1.06  --   CALCIUM 8.6*  --   --  7.8*  --   MG  --   --   --  1.9  --   PHOS  --   --   --  2.6  --    GFR: Estimated Creatinine Clearance: 67.9 mL/min (by C-G  formula based on SCr of 1.06 mg/dL). Recent Labs  Lab 07/05/2021 2033 08/03/2021 2317 07/26/21 0055  PROCALCITON  --  0.18 0.46  WBC 13.0*  --  13.1*  LATICACIDVEN 5.4* 3.2*  --     Liver Function Tests: Recent Labs  Lab 08/01/2021 2033  AST 320*  ALT 396*  ALKPHOS 89  BILITOT 0.2*  PROT 6.0*  ALBUMIN 2.6*   No results for input(s): LIPASE, AMYLASE in the last 168 hours. No results for input(s): AMMONIA in the last 168 hours.  ABG    Component Value Date/Time   PHART 7.438 07/26/2021 0431   PCO2ART 33.4 07/26/2021 0431   PO2ART 174 (H) 07/26/2021 0431   HCO3 22.7 07/26/2021 0431   TCO2 24 07/26/2021 0431   ACIDBASEDEF 1.0 07/26/2021 0431   O2SAT 100.0 07/26/2021 0431     Coagulation Profile: No results for input(s): INR, PROTIME in the last 168 hours.  Cardiac Enzymes: No results for input(s): CKTOTAL, CKMB, CKMBINDEX, TROPONINI in the last 168 hours.  HbA1C: No results found for: HGBA1C  CBG: Recent Labs  Lab 07/26/21 0332  GLUCAP 142*    Critical care time: 40 minutes    Roselie Awkward, MD [place] [facility] Pager: [phone] Cell: [phone] After 7:00 pm call Elink  [phone]

## 2021-07-26 NOTE — Progress Notes (Signed)
Echocardiogram 2D Echocardiogram has been performed.  Blake Medina 07/26/2021, 10:53 AM

## 2021-07-26 NOTE — Progress Notes (Signed)
Got consult request asking IV team to put in 4th PIV. Discussed with her ability to hang drips together and stagger meds. RN did say that added PIV  is on hold for now.

## 2021-07-26 NOTE — Progress Notes (Signed)
Nutrition Brief Note  Chart reviewed. Pt now transitioning to comfort care. No further nutrition interventions planned at this time. Please consult as needed.   Kate Camaya Gannett, MS, RD, LDN Inpatient Clinical Dietitian Please see AMiON for contact information.  

## 2021-07-26 NOTE — Progress Notes (Signed)
ANTICOAGULATION CONSULT NOTE - Initial Consult  Pharmacy Consult for heparin Indication: atrial fibrillation  No Known Allergies  Patient Measurements: Height: 6\' 2"  (188 cm) Weight: 96.7 kg (213 lb 3 oz) IBW/kg (Calculated) : 82.2  Vital Signs: Temp: 97.9 F (36.6 C) (01/23 0007) Temp Source: Axillary (01/23 0007) BP: 108/40 (01/23 0355) Pulse Rate: 96 (01/23 0600)  Labs: Recent Labs    07-26-2021 2033 2021/07/26 2040 07/26/21 2235 July 26, 2021 2317 07/26/21 0055 07/26/21 0431 07/26/21 0630  HGB 11.9* 12.2* 10.5*  --  11.1* 10.2*  --   HCT 35.9* 36.0* 31.0*  --  33.3* 30.0*  --   PLT 289  --   --   --  270  --   --   HEPARINUNFRC  --   --   --   --   --   --  0.47  CREATININE 1.20 1.00  --   --  1.06  --   --   TROPONINIHS 113*  --   --  1,614*  --   --   --      Estimated Creatinine Clearance: 67.9 mL/min (by C-G formula based on SCr of 1.06 mg/dL).   Medical History: History reviewed. No pertinent past medical history.  Medications:  Medications Prior to Admission  Medication Sig Dispense Refill Last Dose   buPROPion (WELLBUTRIN XL) 150 MG 24 hr tablet Take 150 mg by mouth daily.   July 26, 2021   finasteride (PROSCAR) 5 MG tablet Take 1 tablet (5 mg total) by mouth daily. (Patient taking differently: Take 5 mg by mouth at bedtime.) 30 tablet 3 07/24/2021   Infant Care Products (DERMACLOUD) OINT Apply 1 application topically See admin instructions. Apply to scrotum buttocks sacrum every shift for wound healing   July 26, 2021   nitroGLYCERIN (NITROSTAT) 0.4 MG SL tablet Place 0.4 mg under the tongue every 5 (five) minutes as needed for chest pain.   2021-07-26   traZODone (DESYREL) 50 MG tablet Take 50 mg by mouth at bedtime as needed for sleep.   unk   cephALEXin (KEFLEX) 500 MG capsule Take 500 mg by mouth See admin instructions. Qid x 1 day (Patient not taking: Reported on 2021-07-26)   Completed Course   midodrine (PROAMATINE) 10 MG tablet Take 10 mg by mouth every 8 (eight)  hours. (Patient not taking: Reported on 07/26/2021)   Not Taking   sertraline (ZOLOFT) 25 MG tablet Take 25 mg by mouth daily. (Patient not taking: Reported on July 26, 2021)   Not Taking    Assessment: 36 YOM who presented with cardiac arrest obtain ROSC. Pharmacy consulted to start IV heparin for new onset AFib.   Heparin level 0.47 (on heparin 1300 units/hr) No signs/symptoms of bleed  Goal of Therapy:  Heparin level 0.3-0.7 units/ml Monitor platelets by anticoagulation protocol: Yes   Plan:  Heparin drip at 1300 units/hr Heparin level at 1300 Daily heparin level and CBC ordered Monitor for signs/symptoms of bleed  Thank you for allowing pharmacy to be a part of this patients care.  62, PharmD Clinical Pharmacist  Please check AMION for all [facility] Pharmacy numbers After 10:00 PM, call Main Pharmacy [phone]

## 2021-07-26 NOTE — Progress Notes (Signed)
eLink Physician-Brief Progress Note Patient Name: Blake Medina DOB: 1945-11-06 MRN: [id number]   Date of Service  07/26/2021  HPI/Events of Note  76 y/o M w/ Out of hospital cardiac arrest, unknown rhythm, unknown downtime. He had  Chest pain at SNF. Nitro given, later found down, ROSC s/p 5 rounds CPR, 1 shock. Afib with RVR on EMS arrival. Had recently been admitted to [facility] 12/21-1/3 with obstructive uropathy.   Now Admitted to ICU . Not on pressors currently, actually hypertensive.  Seen on Elink cam- actively seizing vs myoclonic jerks   eICU Interventions  -Propofol infusion and Keppra started -myoclonic jerks and/or seizure activity poor prognostic signs even at this early stage post arrest.  -agree with limited code or DNR status.  -vent support for now  - EEG ordered by admitting team  - heparin ordered for possible cardiac/Pulmonary vascular event triggering this        [place] 07/26/2021, 12:59 AM

## 2021-07-26 NOTE — TOC Progression Note (Signed)
Transition of Care [facility]) - Initial/Assessment Note    Patient Details  Name: Blake Medina MRN: [id number] Date of Birth: 07-Dec-1945  Transition of [facility]) CM/SW Contact:    Milinda Antis, [place] Phone Number: 07/26/2021, 10:58 AM  Clinical Narrative:                  Patient admitted after Cardiac Arrest.  Patient is from [facility] (short term).  Plan is for patient's cousin to come in today in person to discuss [place] with medical staff.    Transition of Care Department [facility]) has reviewed patient and no TOC needs have been identified at this time. We will continue to monitor patient advancement through interdisciplinary progression rounds. If new patient transition needs arise, please place a TOC consult.          Patient Goals and CMS Choice        Expected Discharge Plan and Services                                                Prior Living Arrangements/Services                       Activities of Daily Living      Permission Sought/Granted                  Emotional Assessment              Admission diagnosis:  Cardiac arrest ([place]) [I46.9] Lactic acidosis [E87.20] New onset atrial fibrillation ([facility]) [I48.91] Acute respiratory failure, unspecified whether with hypoxia or hypercapnia ([place]) [J96.00] Patient Active Problem List   Diagnosis Date Noted   Cardiac arrest ([place]) 07/24/2021   Shock ([place])    Acute respiratory failure ([place])    Lactic acidosis    AKI (acute kidney injury) ([place]) 07/04/2021   Hypertensive urgency 07/04/2021   Hematuria [id number]   Metabolic acidosis [id number]   Uremia 07/04/2021   BPH (benign prostatic hyperplasia) 07/04/2021   Bilateral hydronephrosis 07/04/2021   Acute renal failure (ARF) ([place]) 07/03/2021   Acute lower UTI 07/03/2021   Obstructive uropathy 07/03/2021   PCP:  Pcp, No Pharmacy:   [id number] [address]  [place] [address] Phone: [phone] Fax: [phone]     Social Determinants of Health (SDOH) Interventions    Readmission Risk Interventions No flowsheet data found.

## 2021-07-26 NOTE — Progress Notes (Signed)
Arrived to 6n7 at this time. Unresponsive. Morphine drip continues. No visitors at bedside.

## 2021-07-26 NOTE — Plan of Care (Signed)
Seen earlier this morning by Dr. Amada Jupiter.  Exam remains poor with positive cough and gag but no meaningful response to noxious stimulation. Can remove the rapid EEG. Prognosis remains grim for any neurological meaningful recovery given the initial presentation and EEG findings as documented by Dr. Amada Jupiter. May consider MRI or repeat CT in a day to evaluate for objective imaging evidence of anoxic brain injury if desired. Will follow Discussed with Dr. Kendrick Fries.  -- Milon Dikes, MD Neurologist [facility] Pager: [phone]

## 2021-07-26 NOTE — Progress Notes (Signed)
Pt comfort care. Transported to 14M-07 with no complications. Pt sats 85%, 107 bpm, 80s/30s (55).

## 2021-07-26 NOTE — Consult Note (Signed)
Neurology Consultation Reason for Consult: post-anoxic myoclonus Referring Physician:  Nallamothu, Ravin  CC: Coma  History is obtained from: Chart review  HPI: Blake Medina is a 76 y.o. male who was recently admitted with renal failure secondary to retention who suffered a cardiac arrest today.  He developed chest pain, took nitro and then was found down.  CPR was done in the field one shock was given by the AED, but unclear what rhythm.  Since that time, he has begun having generalized myoclonic jerks.  Neurology has been consulted for this.   ROS:  Unable to obtain due to altered mental status.   History reviewed. No pertinent past medical history.   No family history on file.   Social History:  reports that he has quit smoking. His smoking use included cigarettes. He has never used smokeless tobacco. He reports that he does not currently use alcohol. He reports that he does not use drugs.   Exam: Current vital signs: BP (!) 108/40    Pulse 98    Temp 97.9 F (36.6 C) (Axillary)    Resp (!) 24    Ht 6\' 2"  (1.88 m)    Wt 85.5 kg    SpO2 100%    BMI 24.20 kg/m  Vital signs in last 24 hours: Temp:  [97.7 F (36.5 C)-97.9 F (36.6 C)] 97.9 F (36.6 C) (01/23 0007) Pulse Rate:  [75-138] 98 (01/23 0355) Resp:  [14-42] 24 (01/23 0355) BP: (77-156)/(40-85) 108/40 (01/23 0355) SpO2:  [99 %-100 %] 100 % (01/23 0355) Arterial Line BP: (112-190)/(46-67) 157/52 (01/23 0300) FiO2 (%):  [40 %-100 %] 40 % (01/23 0355) Weight:  [85.5 kg] 85.5 kg (01/22 2049)   Physical Exam  Constitutional: Appears elderly   Neuro: Mental Status: Patient is comatose, he does not open eyes or follow commands Cranial Nerves: II: He does not blink to threat.  Pupils are equal, round, and reactive to light.   III,IV, VI: Minimal response to doll's eye maneuver V: VII: Corneals are intact X: Chicken cough reflex precipitates myoclonus Motor: Extensor posturing in the right upper extremity,  flexion in left upper extremity, flexion in bilateral lower extremities  sensory: As above  Cerebellar: Does not perform  He has occasional generalized myoclonus.   I have reviewed labs in epic and the results pertinent to this consultation are: Sodium 134 Creatinine 1.06  I have reviewed the images obtained: CT head-it appears to me that he has lost definition of the deep gray matter  Impression: 76 year old male with devastating anoxic injury.  The characteristic of his myoclonus, coupled with the EEG which is attenuated with GPEDs appearing with any weaning of propofol are highly correlated to dismal prognosis.  I will leave his cerebrell connected for now, but can take it off in the morning and repeat his CT as a suspect the findings will be more prominent.  Either way, especially given that he has expressed in the past some desire to have some limitations in care, I think that early discussions with family will be appropriate.  Propofol can be used to suppress these movements, but has not been shown to improve outcome.  Keppra can also sometimes be helpful.    Recommendations: 1) palliative discussions with family 2) discontinue EEG prior to CT 3) continue Keppra 1 g twice daily 4) can use propofol to suppress movements.  This patient is critically ill and at significant risk of neurological worsening, death and care requires constant monitoring of vital signs, hemodynamics,respiratory  and cardiac monitoring, neurological assessment, discussion with family, other specialists and medical decision making of high complexity. I spent 45 minutes of neurocritical care time  in the care of  this patient. This was time spent independent of any time provided by nurse practitioner or PA.  Roland Rack, MD [facility] [phone]  If 7pm- 7am, please page neurology on call as listed in [place]. 07/26/2021  4:25 AM

## 2021-07-26 NOTE — Plan of Care (Signed)
°  Problem: Education: Goal: Knowledge of the prescribed therapeutic regimen will improve Outcome: Not Progressing   Problem: Coping: Goal: Ability to identify and develop effective coping behavior will improve Outcome: Not Progressing   Problem: Clinical Measurements: Goal: Quality of life will improve Outcome: Not Progressing   Problem: Respiratory: Goal: Verbalizations of increased ease of respirations will increase Outcome: Not Progressing   Problem: Role Relationship: Goal: Family's ability to cope with current situation will improve Outcome: Not Progressing Goal: Ability to verbalize concerns, feelings, and thoughts to partner or family member will improve Outcome: Not Progressing

## 2021-07-26 NOTE — Plan of Care (Signed)
°  Interdisciplinary Goals of Care Family Meeting   Date carried out:: 07/26/2021  Location of the meeting: Conference room  Member's involved: Physician, Bedside Registered Nurse, and Family Member or next of kin  Durable Power of Attorney or acting medical decision maker: Blake Medina    Discussion: We discussed goals of care for Blake Medina .  The patient has severe anoxic brain injury with no chance of meaningful neurologic recovery.  He has stated in the past that he under no circumstances wants to be on life support.  Based on his current condition and prior stated wishes we will withdraw care.  Code status: Full DNR  Disposition: In-patient comfort care  Time spent for the meeting: 10 minutes  Blake Medina 07/26/2021, 11:32 AM

## 2021-07-26 NOTE — Procedures (Signed)
Patient Name: Blake Medina  MRN: [id number]  Epilepsy Attending: Charlsie Quest  Referring Physician/Provider: Dr Ritta Slot Duration: 07/26/2021 0328 to 0910  Patient history: 76yo M s/p cardiac arrest. EEG to evaluate for seizure  Level of alertness:  comatose  AEDs during EEG study: propofol, LEV  Technical aspects: This EEG was obtained using a 10 lead EEG system positioned circumferentially without any parasagittal coverage (rapid EEG). Computer selected EEG is reviewed as  well as background features and all clinically significant events.  Description: EEG showed generalized suppression. Generalized periodic epileptiform discharges with overriding fast activity were noted at 1hz . Hyperventilation and photic stimulation were not performed.     ABNORMALITY - Periodic discharges with overriding fast activity, generalized ( GPDs) - Background suppression, generalized  IMPRESSION: This limited ceribell EEG showed evidence of epileptogenicity with generalized onset and high potential for seizures. There is also profound diffuse encephalopathy, nonspecific etiology but likely related to anoxic/hypoxic brain injury in the setting of cardiac arrest.   Recommend video eeg monitoring if concern for seizures persist.   Jesseka Drinkard 

## 2021-07-27 DIAGNOSIS — I469 Cardiac arrest, cause unspecified: Secondary | ICD-10-CM | POA: Diagnosis not present

## 2021-07-27 LAB — LEGIONELLA PNEUMOPHILA SEROGP 1 UR AG: L. pneumophila Serogp 1 Ur Ag: POSITIVE — AB

## 2021-07-30 LAB — CULTURE, BLOOD (ROUTINE X 2)
Culture: NO GROWTH
Special Requests: ADEQUATE

## 2021-07-31 LAB — CULTURE, BLOOD (ROUTINE X 2): Culture: NO GROWTH

## 2021-08-04 NOTE — Death Summary Note (Signed)
DEATH SUMMARY   Patient Details  Name: Blake Medina MRN: [id number] DOB: 05/16/1946  Admission/Discharge Information   Admit Date:  08/18/21  Date of Death: Date of Death: 08/20/2021  Time of Death: Time of Death: 68  Length of Stay: 2  Referring Physician: Pcp, No   Reason(s) for Hospitalization  Patient was admitted to the hospital following a cardiac arrest  Diagnoses  Preliminary cause of death:  Myocardial infarction Anoxic brain injury Secondary Diagnoses (including complications and co-morbidities):  Principal Problem:   Cardiac arrest [facility]) Acute kidney injury BPH Hypertension Atrial fibrillation  Brief Hospital Course (including significant findings, care, treatment, and services provided and events leading to death)  Blake Medina is a 76 y.o. year old male who was brought to the hospital following suffering a cardiac arrest at his nursing home.  He had had chest pain.  Downtime was unknown.  Noted to have myoclonic jerks suggesting ischemic brain injury. CT scan of the head, EEG findings, neurological evaluation all consistent with devastating anoxic brain injury. Goals of care discussions with family on 07/26/2021 and based on patient's wishes it was made a DO NOT RESUSCITATE and compassionate withdrawal of aggressive measures.  Was made comfort measures only. Succumbed to his illness on 08/20/2021 at 1740 hrs.    Pertinent Labs and Studies  Significant Diagnostic Studies CT ABDOMEN PELVIS WO CONTRAST  Addendum Date: 07/03/2021   ADDENDUM REPORT: 07/03/2021 17:01 IMPRESSION: 1. Numerous bladder calculi are present. 2. Heterogeneous hyperdensity in the bladder suspicious for hemorrhage measuring 7.0 x 5.6 x 8.1 cm. Bladder neoplasm can not be excluded. 3. Bladder is distended despite Foley catheter placement. There is mild bilateral hydroureteronephrosis. 4. Marked prostatomegaly. 5. There are pulmonary nodules in the left lower lobe measuring up to 5 mm.  Consider follow-up dedicated chest CT for further evaluation. No follow-up needed if patient is low-risk (and has no known or suspected primary neoplasm). Non-contrast chest CT can be considered in 12 months if patient is high-risk. This recommendation follows the consensus statement: Guidelines for Management of Incidental Pulmonary Nodules Detected on CT Images: From the Fleischner Society 2017; Radiology 2017; 284:228-243. Electronically Signed   By: Ronney Asters M.D.   On: 07/03/2021 17:01   Result Date: 07/03/2021 CLINICAL DATA:  Acute abdominal pain.  Fall 1 week ago. EXAM: CT ABDOMEN AND PELVIS WITHOUT CONTRAST TECHNIQUE: Multidetector CT imaging of the abdomen and pelvis was performed following the standard protocol without IV contrast. COMPARISON:  None. FINDINGS: Lower chest: There is a 5 mm left lower lobe pulmonary nodule. Scratch there are 2 pulmonary nodules in the scratch there are 3 pulmonary nodules in the visualized left lower lobe measuring up to 5 mm. There is some tree-in-bud opacities in the left lung base, likely infectious/inflammatory. Hepatobiliary: No focal liver abnormality is seen. No gallstones, gallbladder wall thickening, or biliary dilatation. Pancreas: Unremarkable. No pancreatic ductal dilatation or surrounding inflammatory changes. Spleen: Normal in size without focal abnormality. Adrenals/Urinary Tract: There is mild bilateral hydronephrosis to the level of the bladder. No obstructing calculi are seen. No focal renal lesions are identified. The adrenal glands are within normal limits. Foley catheter is in the bladder. There is heterogeneous mild hyperdensity in the bladder suspicious for bladder mass versus hemorrhage measuring 7.5 by 5.0 by 4.3 cm. There are numerous bladder calculi measuring up to 15 mm. There is no inflammation surrounding the bladder. Small amount of air in the bladder is likely related to Foley catheter placement. Stomach/Bowel: No evidence of bowel  wall  thickening, distention, or inflammatory changes. The appendix is not visualized. There is a small hiatal hernia. Stomach is otherwise within normal limits. Vascular/Lymphatic: Aortic atherosclerosis. No enlarged abdominal or pelvic lymph nodes. Reproductive: Prostate gland is enlarged measuring 7.0 x 5.6 by 8.1 cm. Other: No abdominal wall hernia or abnormality. No abdominopelvic ascites. Musculoskeletal: Degenerative changes affect the spine and hips. No acute fractures are seen. IMPRESSION: 1.  Numerous bladder calculi are present. 2. Heterogeneous hyperdensity in the bladder suspicious for hemorrhage measuring 7.0 x 5.6 x 8.1 cm. Bladder neoplasm can not be excluded. 3. Bladder is distended despite Foley catheter placement. There is mild bilateral hydroureteronephrosis. 4.  Marked prostatomegaly. Electronically Signed: By: Ronney Asters M.D. On: 07/03/2021 16:53   CT Head Wo Contrast  Result Date: 07/15/2021 CLINICAL DATA:  Mental status change, unknown cause. EXAM: CT HEAD WITHOUT CONTRAST TECHNIQUE: Contiguous axial images were obtained from the base of the skull through the vertex without intravenous contrast. RADIATION DOSE REDUCTION: This exam was performed according to the departmental dose-optimization program which includes automated exposure control, adjustment of the mA and/or kV according to patient size and/or use of iterative reconstruction technique. COMPARISON:  None. FINDINGS: Brain: No acute intracranial hemorrhage, midline shift or mass effect. No extra-axial fluid collection. Diffuse atrophy is noted. Periventricular white matter hypodensities are seen bilaterally. There are focal hypodensities in the corona radiata on the right and basal ganglia bilaterally, possible lacunar infarcts of indeterminate age. No hydrocephalus. Vascular: No hyperdense vessel or unexpected calcification. Skull: Normal. Negative for fracture or focal lesion. Sinuses/Orbits: No acute finding. Other: None.  IMPRESSION: 1. No acute intracranial hemorrhage. 2. Atrophy with chronic microvascular ischemic changes. There are scattered hypodensities in the corona radiata on the right and basal ganglia bilaterally, suggesting lacunar infarcts of indeterminate age. Comparison with older imaging studies or MRI is suggested for follow-up. Electronically Signed   By: Brett Fairy M.D.   On: 07/15/2021 22:04   US Renal  Result Date: 07/03/2021 CLINICAL DATA:  Initial evaluation for hematuria, urinary retention. EXAM: RENAL / URINARY TRACT ULTRASOUND COMPLETE COMPARISON:  Prior CT from 06/23/2021. FINDINGS: Right Kidney: Renal measurements: 11.9 x 5.2 x 6.9 cm = volume: 224.8 mL. Renal echogenicity within normal limits. No nephrolithiasis or hydronephrosis. No focal renal mass. Left Kidney: Renal measurements: 11.2 x 5.1 x 6.2 cm = volume: 187.0 mL. Renal echogenicity within normal limits. No nephrolithiasis. Mild pelviectasis without overt hydronephrosis. No focal renal mass. Bladder: Not assessed on this examination. Other: None. IMPRESSION: 1. Mild left renal pelviectasis without overt hydronephrosis. 2. Otherwise unremarkable and normal renal ultrasound. Electronically Signed   By: Jeannine Boga M.D.   On: 07/03/2021 19:30   DG Chest Portable 1 View  Result Date: 07/22/2021 CLINICAL DATA:  Status post CPR and intubation. EXAM: PORTABLE CHEST 1 VIEW COMPARISON:  None. FINDINGS: Endotracheal tube with tip approximately 15 mm above the carina. Recommend retraction by 3 cm for optimal positioning. Enteric tube extends below the diaphragm with tip beyond the inferior margin of the image. Minimal bibasilar atelectasis. Faint bilateral streaky densities may represent atelectasis. Pulmonary contusion or atypical infection is not excluded. No focal consolidation, pleural effusion, or pneumothorax. The cardiac silhouette is within normal limits. No acute osseous pathology. IMPRESSION: 1. Endotracheal tube with tip above  the carina. Recommend retraction by 3 cm for optimal positioning. 2. No consolidation. Electronically Signed   By: Anner Crete M.D.   On: 07/18/2021 20:50   ECHOCARDIOGRAM COMPLETE  Result Date: 07/26/2021  ECHOCARDIOGRAM REPORT   Patient Name:   DEREN CAZENAVE Date of Exam: 07/26/2021 Medical Rec #:  [id number]     Height:       74.0 in Accession #:    [id number]    Weight:       213.2 lb Date of Birth:  Mar 05, 1946     BSA:          2.233 m Patient Age:    1 years      BP:           108/40 mmHg Patient Gender: M             HR:           96 bpm. Exam Location:  Inpatient Procedure: 2D Echo Indications:    Cardiac arrest  History:        Patient has no prior history of Echocardiogram examinations.  Sonographer:    Arlyss Gandy Referring Phys: [id number] Estill Cotta  Sonographer Comments: Patient supine and intubed IMPRESSIONS  1. Abnormal septal motion . Left ventricular ejection fraction, by estimation, is 50 to 55%. The left ventricle has low normal function. The left ventricle has no regional wall motion abnormalities. Left ventricular diastolic parameters were normal.  2. Right ventricular systolic function is normal. The right ventricular size is normal. There is normal pulmonary artery systolic pressure.  3. The mitral valve is normal in structure. Trivial mitral valve regurgitation. No evidence of mitral stenosis.  4. The aortic valve is tricuspid. There is mild calcification of the aortic valve. Aortic valve regurgitation is not visualized. Aortic valve sclerosis is present, with no evidence of aortic valve stenosis.  5. The inferior vena cava is normal in size with greater than 50% respiratory variability, suggesting right atrial pressure of 3 mmHg. FINDINGS  Left Ventricle: Abnormal septal motion. Left ventricular ejection fraction, by estimation, is 50 to 55%. The left ventricle has low normal function. The left ventricle has no regional wall motion abnormalities. The left ventricular internal  cavity size was normal in size. There is no left ventricular hypertrophy. Left ventricular diastolic parameters were normal. Right Ventricle: The right ventricular size is normal. No increase in right ventricular wall thickness. Right ventricular systolic function is normal. There is normal pulmonary artery systolic pressure. The tricuspid regurgitant velocity is 1.85 m/s, and  with an assumed right atrial pressure of 8 mmHg, the estimated right ventricular systolic pressure is 0000000 mmHg. Left Atrium: Left atrial size was normal in size. Right Atrium: Right atrial size was normal in size. Pericardium: There is no evidence of pericardial effusion. Mitral Valve: The mitral valve is normal in structure. Trivial mitral valve regurgitation. No evidence of mitral valve stenosis. Tricuspid Valve: The tricuspid valve is normal in structure. Tricuspid valve regurgitation is mild . No evidence of tricuspid stenosis. Aortic Valve: The aortic valve is tricuspid. There is mild calcification of the aortic valve. Aortic valve regurgitation is not visualized. Aortic valve sclerosis is present, with no evidence of aortic valve stenosis. Aortic valve mean gradient measures 5.0 mmHg. Aortic valve peak gradient measures 8.6 mmHg. Aortic valve area, by VTI measures 3.05 cm. Pulmonic Valve: The pulmonic valve was normal in structure. Pulmonic valve regurgitation is not visualized. No evidence of pulmonic stenosis. Aorta: The aortic root is normal in size and structure. Venous: The inferior vena cava is normal in size with greater than 50% respiratory variability, suggesting right atrial pressure of 3 mmHg. IAS/Shunts: No atrial level shunt detected by color flow  Doppler.  LEFT VENTRICLE PLAX 2D LVIDd:         4.70 cm   Diastology LVIDs:         3.50 cm   LV e' medial:    5.22 cm/s LV PW:         1.00 cm   LV E/e' medial:  12.9 LV IVS:        1.00 cm   LV e' lateral:   10.40 cm/s LVOT diam:     2.10 cm   LV E/e' lateral: 6.5 LV SV:          78 LV SV Index:   35 LVOT Area:     3.46 cm  RIGHT VENTRICLE RV Basal diam:  3.70 cm RV Mid diam:    2.90 cm RV S prime:     12.30 cm/s TAPSE (M-mode): 2.3 cm LEFT ATRIUM             Index        RIGHT ATRIUM           Index LA diam:        2.60 cm 1.16 cm/m   RA Area:     14.10 cm LA Vol (A2C):   48.5 ml 21.72 ml/m  RA Volume:   33.70 ml  15.09 ml/m LA Vol (A4C):   38.3 ml 17.15 ml/m LA Biplane Vol: 45.6 ml 20.42 ml/m  AORTIC VALVE AV Area (Vmax):    3.13 cm AV Area (Vmean):   3.10 cm AV Area (VTI):     3.05 cm AV Vmax:           147.00 cm/s AV Vmean:          101.000 cm/s AV VTI:            0.257 m AV Peak Grad:      8.6 mmHg AV Mean Grad:      5.0 mmHg LVOT Vmax:         133.00 cm/s LVOT Vmean:        90.300 cm/s LVOT VTI:          0.226 m LVOT/AV VTI ratio: 0.88  AORTA Ao Root diam: 3.00 cm Ao Asc diam:  3.00 cm MITRAL VALVE               TRICUSPID VALVE MV Area (PHT): 4.10 cm    TR Peak grad:   13.7 mmHg MV Decel Time: 185 msec    TR Vmax:        185.00 cm/s MV E velocity: 67.30 cm/s MV A velocity: 68.20 cm/s  SHUNTS MV E/A ratio:  0.99        Systemic VTI:  0.23 m                            Systemic Diam: 2.10 cm Jenkins Rouge MD Electronically signed by Jenkins Rouge MD Signature Date/Time: 07/26/2021/11:22:16 AM    Final     Microbiology Recent Results (from the past 240 hour(s))  Culture, blood (x 2)     Status: None (Preliminary result)   Collection Time: 08/01/2021  8:34 PM   Specimen: BLOOD  Result Value Ref Range Status   Specimen Description BLOOD RIGHT ANTECUBITAL  Final   Special Requests   Final    BOTTLES DRAWN AEROBIC AND ANAEROBIC Blood Culture adequate volume   Culture   Final    NO GROWTH 2 DAYS Performed at  [facility], [place] [address], [place], [place] [address]    Report Status PENDING  Incomplete  Resp Panel by RT-PCR (Flu A&B, Covid) Nasopharyngeal Swab     Status: None   Collection Time: 07/21/2021  8:50 PM   Specimen: Nasopharyngeal Swab; Nasopharyngeal(NP)  swabs in vial transport medium  Result Value Ref Range Status   SARS Coronavirus 2 by RT PCR NEGATIVE NEGATIVE Final    Comment: (NOTE) SARS-CoV-2 target nucleic acids are NOT DETECTED.  The SARS-CoV-2 RNA is generally detectable in upper respiratory specimens during the acute phase of infection. The lowest concentration of SARS-CoV-2 viral copies this assay can detect is 138 copies/mL. A negative result does not preclude SARS-Cov-2 infection and should not be used as the sole basis for treatment or other patient management decisions. A negative result may occur with  improper specimen collection/handling, submission of specimen other than nasopharyngeal swab, presence of viral mutation(s) within the areas targeted by this assay, and inadequate number of viral copies(<138 copies/mL). A negative result must be combined with clinical observations, patient history, and epidemiological information. The expected result is Negative.  Fact Sheet for Patients:  EntrepreneurPulse.com.au  Fact Sheet for Healthcare Providers:  IncredibleEmployment.be  This test is no t yet approved or cleared by the [place] FDA and  has been authorized for detection and/or diagnosis of SARS-CoV-2 by FDA under an Emergency Use Authorization (EUA). This EUA will remain  in effect (meaning this test can be used) for the duration of the COVID-19 declaration under Section 564(b)(1) of the Act, 21 U.S.C.section 360bbb-3(b)(1), unless the authorization is terminated  or revoked sooner.       Influenza A by PCR NEGATIVE NEGATIVE Final   Influenza B by PCR NEGATIVE NEGATIVE Final    Comment: (NOTE) The Xpert Xpress SARS-CoV-2/FLU/RSV plus assay is intended as an aid in the diagnosis of influenza from Nasopharyngeal swab specimens and should not be used as a sole basis for treatment. Nasal washings and aspirates are unacceptable for Xpert Xpress  SARS-CoV-2/FLU/RSV testing.  Fact Sheet for Patients: EntrepreneurPulse.com.au  Fact Sheet for Healthcare Providers: IncredibleEmployment.be  This test is not yet approved or cleared by the [place] FDA and has been authorized for detection and/or diagnosis of SARS-CoV-2 by FDA under an Emergency Use Authorization (EUA). This EUA will remain in effect (meaning this test can be used) for the duration of the COVID-19 declaration under Section 564(b)(1) of the Act, 21 U.S.C. section 360bbb-3(b)(1), unless the authorization is terminated or revoked.  Performed at [facility], [place] [address], [place], [place] [address]   MRSA Next Gen by PCR, Nasal     Status: None   Collection Time: 07/26/21 12:05 AM   Specimen: Nasal Mucosa; Nasal Swab  Result Value Ref Range Status   MRSA by PCR Next Gen NOT DETECTED NOT DETECTED Final    Comment: (NOTE) The GeneXpert MRSA Assay (FDA approved for NASAL specimens only), is one component of a comprehensive MRSA colonization surveillance program. It is not intended to diagnose MRSA infection nor to guide or monitor treatment for MRSA infections. Test performance is not FDA approved in patients less than 39 years old. Performed at [facility], [place] [address] [place],  [address]   Culture, blood (x 2)     Status: None (Preliminary result)   Collection Time: 07/26/21 12:54 AM   Specimen: BLOOD RIGHT HAND  Result Value Ref Range Status   Specimen Description BLOOD RIGHT HAND  Final   Special Requests   Final    BOTTLES DRAWN AEROBIC AND ANAEROBIC Blood Culture results may not be optimal due to an inadequate volume of blood received in culture bottles   Culture   Final    NO GROWTH 1 DAY Performed at [facility] [address] [place], [place] [address]    Report Status PENDING  Incomplete    Lab Basic Metabolic Panel: Recent Labs  Lab 07/22/2021 2033 08/02/2021 2040  07/07/2021 2235 07/26/21 0055 07/26/21 0431  NA 134* 135 134* 134* 136  K 4.3 4.4 4.1 4.3 4.5  CL 101 101  --  104  --   CO2 21*  --   --  20*  --   GLUCOSE 160* 152*  --  173*  --   BUN 24* 25*  --  25*  --   CREATININE 1.20 1.00  --  1.06  --   CALCIUM 8.6*  --   --  7.8*  --   MG  --   --   --  1.9  --   PHOS  --   --   --  2.6  --    Liver Function Tests: Recent Labs  Lab 07/30/2021 2033  AST 320*  ALT 396*  ALKPHOS 89  BILITOT 0.2*  PROT 6.0*  ALBUMIN 2.6*   No results for input(s): LIPASE, AMYLASE in the last 168 hours. No results for input(s): AMMONIA in the last 168 hours. CBC: Recent Labs  Lab 07/20/2021 2033 08/03/2021 2040 07/31/2021 2235 07/26/21 0055 07/26/21 0431  WBC 13.0*  --   --  13.1*  --   NEUTROABS 7.5  --   --   --   --   HGB 11.9* 12.2* 10.5* 11.1* 10.2*  HCT 35.9* 36.0* 31.0* 33.3* 30.0*  MCV 93.5  --   --  92.5  --   PLT 289  --   --  270  --    Cardiac Enzymes: No results for input(s): CKTOTAL, CKMB, CKMBINDEX, TROPONINI in the last 168 hours. Sepsis Labs: Recent Labs  Lab 07/28/2021 2033 08/03/2021 2317 07/26/21 0055  PROCALCITON  --  0.18 0.46  WBC 13.0*  --  13.1*  LATICACIDVEN 5.4* 3.2*  --     Procedures/Operations     Tonesha Tsou A Indy Kuck 07/29/2021, 6:24 PM

## 2021-08-04 NOTE — Progress Notes (Signed)
Pt expired at 17:40 pm.  Charge nurse Prentiss Bells came and assessed the pt to verify pt has expired.  Dr Ander Slade and Dr Lake Bells made aware

## 2021-08-04 NOTE — Progress Notes (Signed)
Blima Ledger was on the chart to contact and I called him and made him aware that Mr Gorby had passed away and he asked me to tell the morgue to call [facility] to pick up the body.  Also to call the one on [address] location.

## 2021-08-04 NOTE — Progress Notes (Signed)
° °  NAME:  Blake Medina, MRN:  [id number], DOB:  12/06/1945, LOS: 2 ADMISSION DATE:  07/16/2021, CONSULTATION DATE:  1/22 REFERRING MD:  Fredderick Phenix, CHIEF COMPLAINT:  Cardiac arrest   History of Present Illness:  76 y/o male who came to the [facility] ED with cardiac arrest after suffering chest pain at his nursing home.  On arrival he had a King airawy in place and was in Atrial fibrillation with RVR.  Pertinent  Medical History  BPH UTI AKI secondary to obstructive uropathy  Significant Hospital Events: Including procedures, antibiotic start and stop dates in addition to other pertinent events   1/22 admitted with out of hospital cardiac arrest, unknown downtime 1/22 ceribell and neurology consultation > devastating neurologic injury from anoxic brain injury 1/23 made full comfort measures  Procedures: 1/22 ETT >   Micro 1/22 blood >  Abx 1/22 cefepime > 1/23 1/22 vanc > 1/23  Imaging: 1/22 CT head > NAICP, atrophy with chronic microvascular ischemic changes, scattered hypodensities   Interim History / Subjective:  Neuro consult: devastating neurologic injury He is comfortable On morphine  Objective   Blood pressure (!) 69/47, pulse 98, temperature 100.3 F (37.9 C), resp. rate 16, height 6\' 2"  (1.88 m), weight 96.7 kg, SpO2 (!) 77 %.    Vent Mode: PRVC FiO2 (%):  [40 %] 40 % Set Rate:  [18 bmp] 18 bmp Vt Set:  [490 mL] 490 mL PEEP:  [5 cmH20] 5 cmH20 Plateau Pressure:  [10 cmH20] 10 cmH20   Intake/Output Summary (Last 24 hours) at 07-30-21 0857 Last data filed at July 30, 2021 0421 Gross per 24 hour  Intake 493.71 ml  Output 975 ml  Net -481.29 ml   Filed Weights   07/24/2021 2049 07/26/21 0500  Weight: 85.5 kg 96.7 kg    Examination:  Appears comfortable and resting Breathing does not appear labored  Assessment & Plan:  Out of hospital cardiac arrest, unknown rhythm Cardiogenic shock Atrial fibrillation with RVR Acute metabolic encephalopathy vs Anoxic brain  injury Acute respiratory failure with hypoxemia Shock liver Normocytic anemia BPH  Overall prognosis is poor Full comfort measures and appears comfortable at present  07/28/21, MD [place] [facility] Pager: See Virl Diamond

## 2021-08-04 DEATH — deceased

## 2022-12-31 IMAGING — CT CT HEAD W/O CM
3 of 4 series · 14 of 47 positions shown, 16 images · non-contrast
Comparison: None.

CLINICAL DATA: Mental status change, unknown cause.



[Series 5: head 2.0 mpr ax · axial · 0.35mm/px · z∈[-743,-625]mm · 8 of 80 slices shown, 10 images]
[im 8/80  brain]
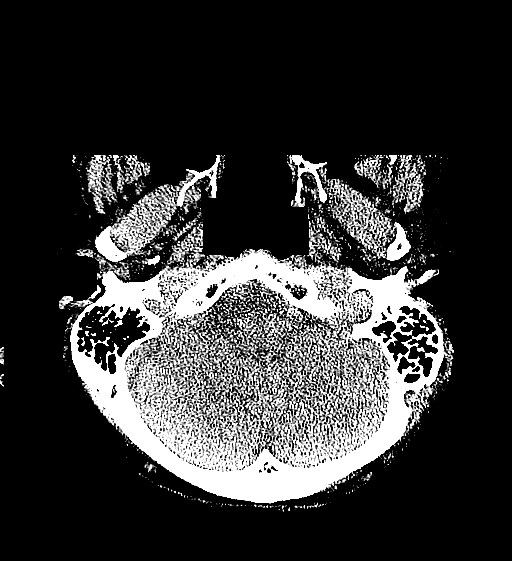
[im 8/80  bone]
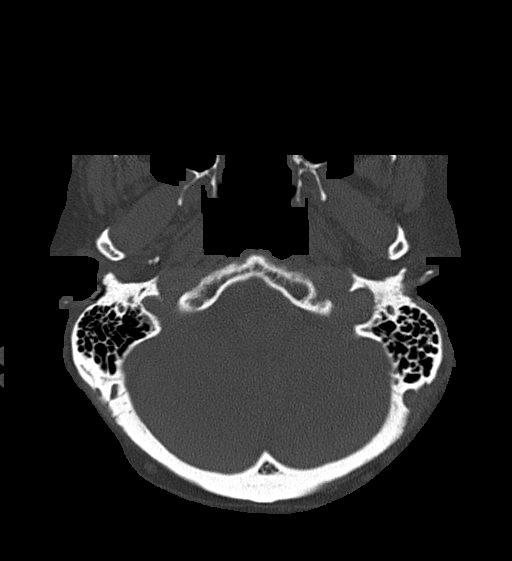
[im 16/80  brain]
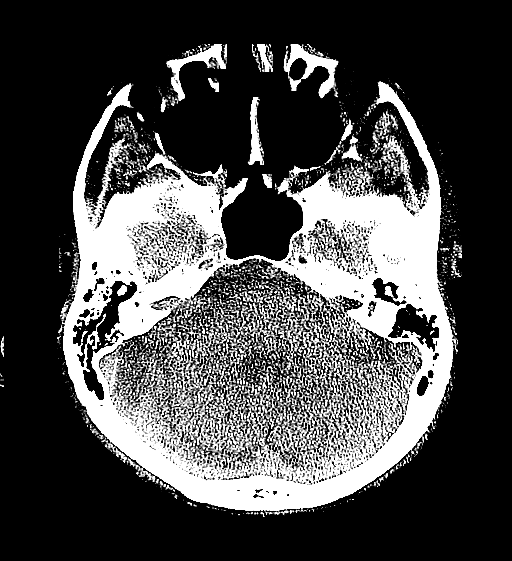
[im 24/80  brain]
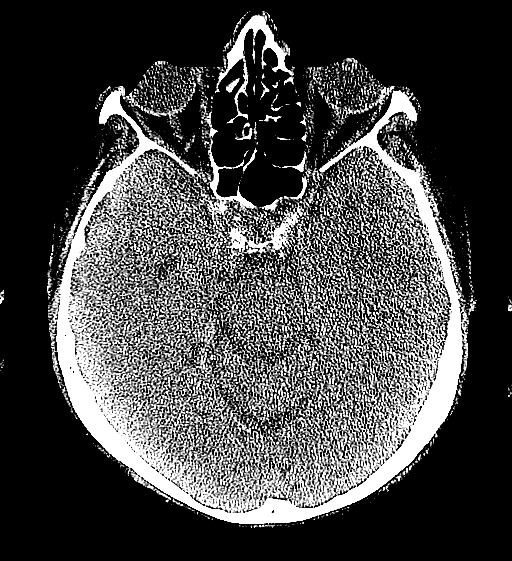
[im 36/80  brain]
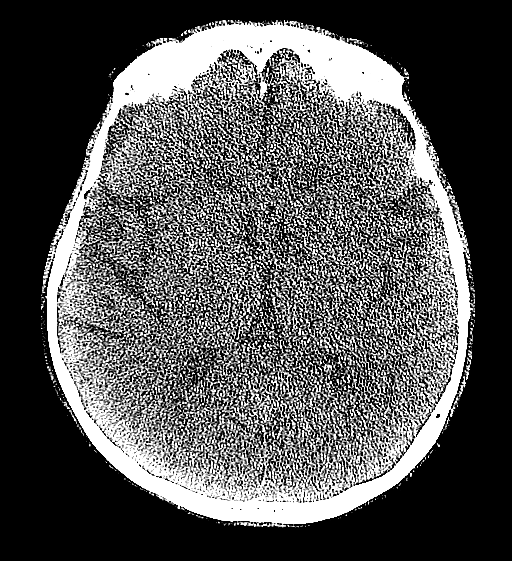
[im 44/80  brain]
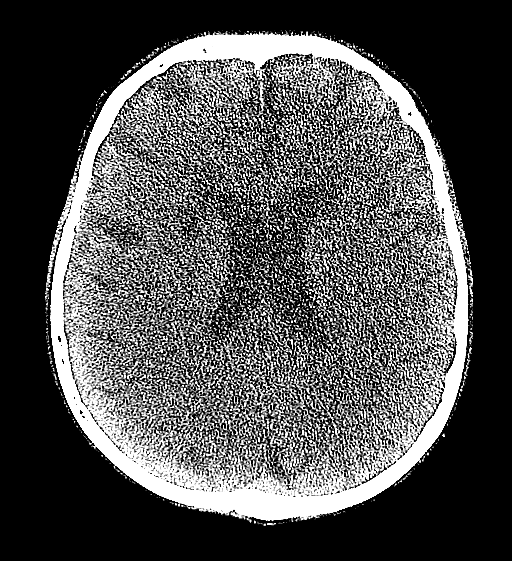
[im 44/80  bone]
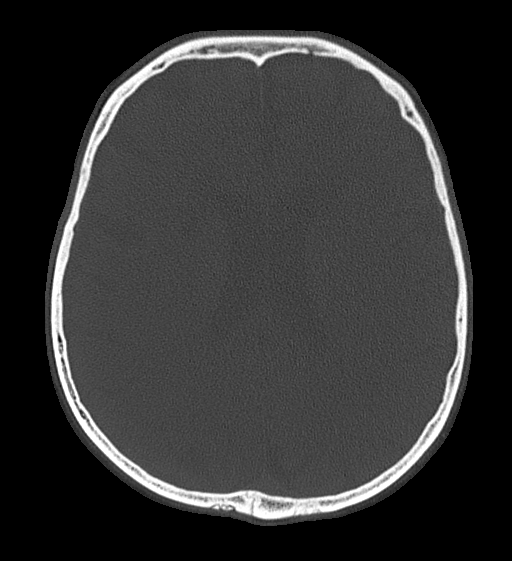
[im 56/80  brain]
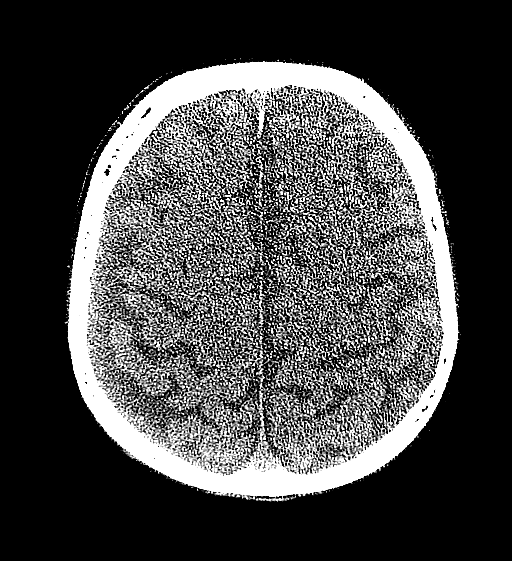
[im 64/80  brain]
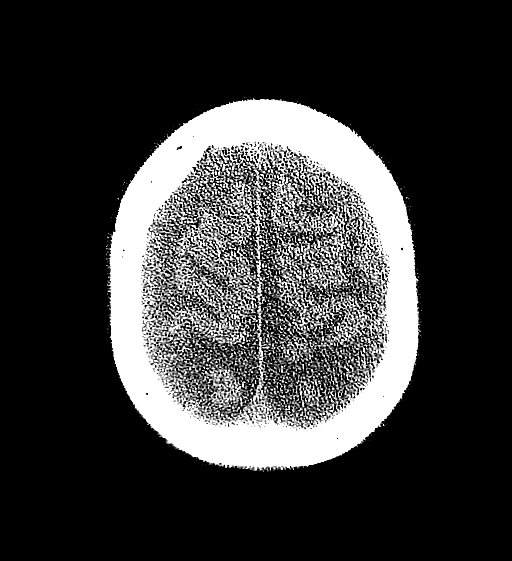
[im 72/80  brain]
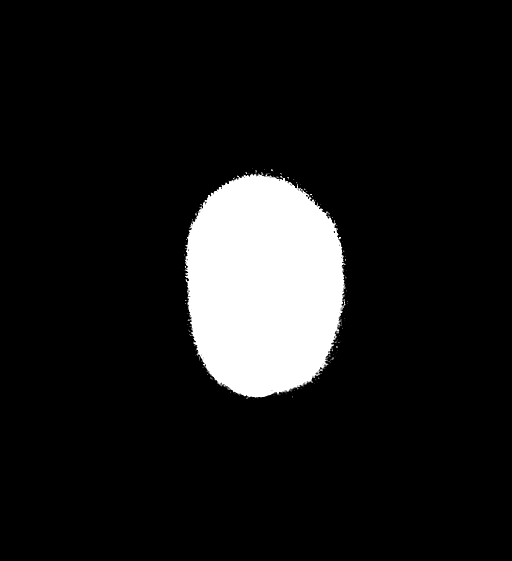

[Series 6: head 3.0 mpr cor · coronal · 0.31mm/px · 3 of 64 slices shown]
[im 22/64  brain]
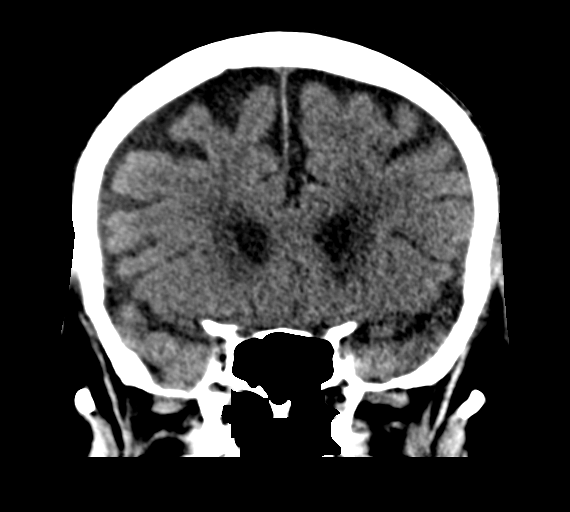
[im 29/64  brain]
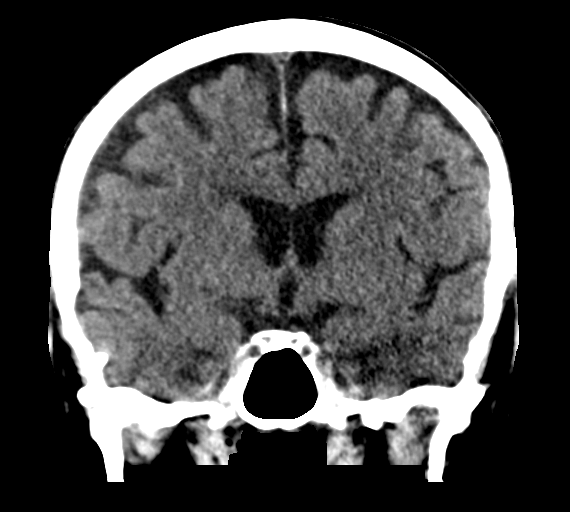
[im 36/64  brain]
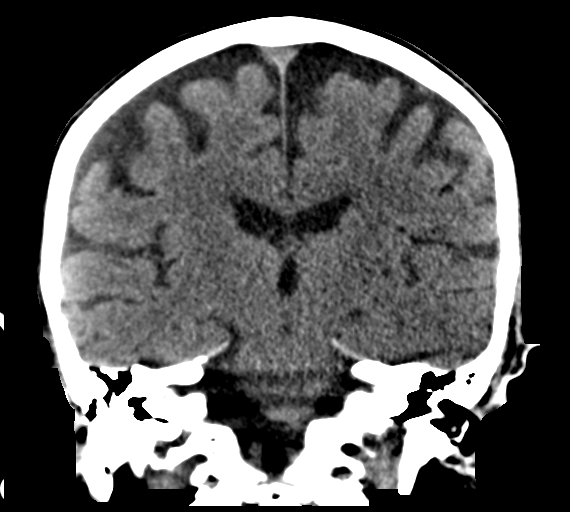

[Series 7: head 3.0 mpr sag · sagittal · 0.31mm/px · 3 of 58 slices shown]
[im 20/58  brain]
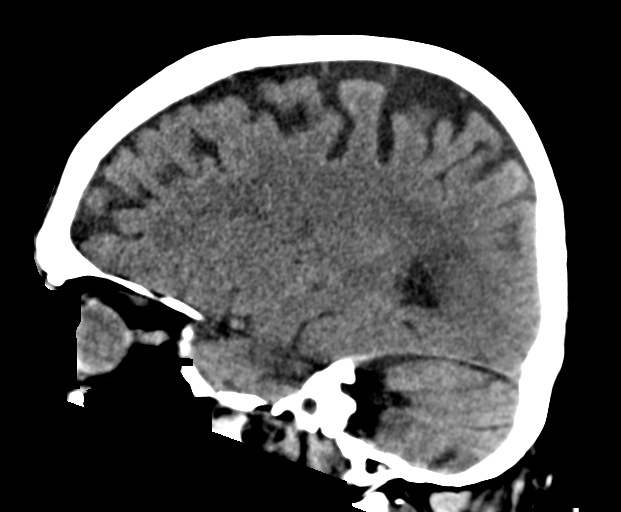
[im 29/58  brain]
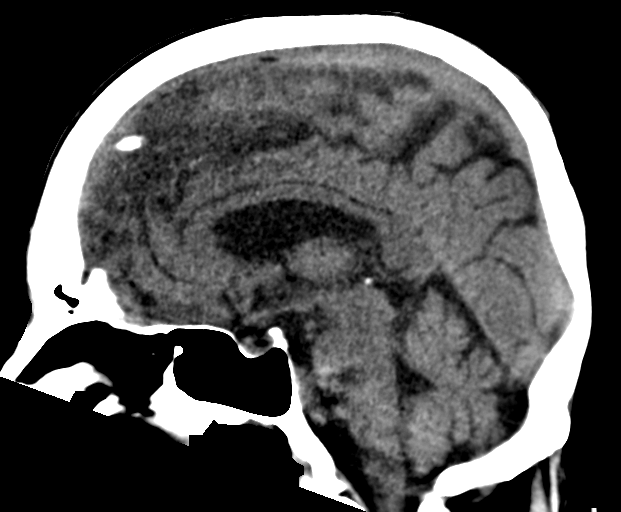
[im 39/58  brain]
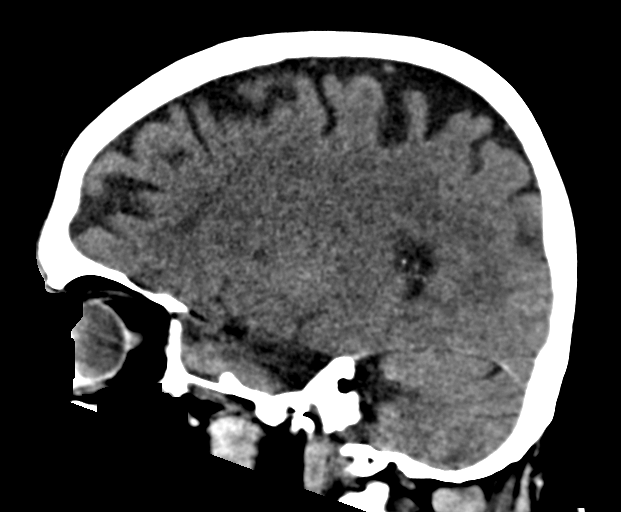

[14 of 47 positions shown; findings below may reference images not displayed]

FINDINGS: Brain: No acute intracranial hemorrhage, midline shift or mass
effect. No extra-axial fluid collection. Diffuse atrophy is noted.
Periventricular white matter hypodensities are seen bilaterally.
There are focal hypodensities in the corona radiata on the right and
basal ganglia bilaterally, possible lacunar infarcts of
indeterminate age. No hydrocephalus.

Vascular: No hyperdense vessel or unexpected calcification.

Skull: Normal. Negative for fracture or focal lesion.

Sinuses/Orbits: No acute finding.

Other: None.
IMPRESSION: 1. No acute intracranial hemorrhage.
2. Atrophy with chronic microvascular ischemic changes. There are
scattered hypodensities in the corona radiata on the right and basal
ganglia bilaterally, suggesting lacunar infarcts of indeterminate
age. Comparison with older imaging studies or MRI is suggested for
follow-up.
